# Patient Record
Sex: Female | Born: 1965
Health system: Southern US, Community
[De-identification: ages and names within clinical notes are randomized; demographics above are authoritative.]

## PROBLEM LIST (undated history)

## (undated) DIAGNOSIS — R079 Chest pain, unspecified: Secondary | ICD-10-CM

## (undated) DIAGNOSIS — R011 Cardiac murmur, unspecified: Secondary | ICD-10-CM

## (undated) DIAGNOSIS — D649 Anemia, unspecified: Secondary | ICD-10-CM

## (undated) HISTORY — DX: Anemia, unspecified: D64.9

## (undated) HISTORY — DX: Chest pain, unspecified: R07.9

---

## 2002-01-18 HISTORY — PX: TUBAL LIGATION: SHX77

## 2002-04-06 ENCOUNTER — Emergency Department (HOSPITAL_COMMUNITY): Admission: EM | Admit: 2002-04-06 | Discharge: 2002-04-07 | Payer: Self-pay | Admitting: Emergency Medicine

## 2002-05-24 ENCOUNTER — Ambulatory Visit (HOSPITAL_COMMUNITY): Admission: RE | Admit: 2002-05-24 | Discharge: 2002-05-24 | Payer: Self-pay | Admitting: *Deleted

## 2002-05-25 ENCOUNTER — Ambulatory Visit (HOSPITAL_COMMUNITY): Admission: RE | Admit: 2002-05-25 | Discharge: 2002-05-25 | Payer: Self-pay | Admitting: *Deleted

## 2002-05-25 ENCOUNTER — Encounter: Payer: Self-pay | Admitting: *Deleted

## 2002-06-05 ENCOUNTER — Ambulatory Visit (HOSPITAL_COMMUNITY): Admission: RE | Admit: 2002-06-05 | Discharge: 2002-06-05 | Payer: Self-pay | Admitting: *Deleted

## 2002-06-05 ENCOUNTER — Encounter: Payer: Self-pay | Admitting: *Deleted

## 2002-07-24 ENCOUNTER — Ambulatory Visit (HOSPITAL_COMMUNITY): Admission: RE | Admit: 2002-07-24 | Discharge: 2002-07-24 | Payer: Self-pay | Admitting: *Deleted

## 2002-08-02 ENCOUNTER — Inpatient Hospital Stay (HOSPITAL_COMMUNITY): Admission: AD | Admit: 2002-08-02 | Discharge: 2002-08-02 | Payer: Self-pay | Admitting: *Deleted

## 2002-08-02 ENCOUNTER — Encounter: Payer: Self-pay | Admitting: Obstetrics & Gynecology

## 2002-08-04 ENCOUNTER — Inpatient Hospital Stay (HOSPITAL_COMMUNITY): Admission: AD | Admit: 2002-08-04 | Discharge: 2002-08-04 | Payer: Self-pay | Admitting: Obstetrics & Gynecology

## 2002-08-14 ENCOUNTER — Ambulatory Visit (HOSPITAL_COMMUNITY): Admission: RE | Admit: 2002-08-14 | Discharge: 2002-08-14 | Payer: Self-pay | Admitting: *Deleted

## 2002-08-21 ENCOUNTER — Inpatient Hospital Stay (HOSPITAL_COMMUNITY): Admission: AD | Admit: 2002-08-21 | Discharge: 2002-08-21 | Payer: Self-pay | Admitting: *Deleted

## 2002-09-02 ENCOUNTER — Inpatient Hospital Stay (HOSPITAL_COMMUNITY): Admission: AD | Admit: 2002-09-02 | Discharge: 2002-09-02 | Payer: Self-pay | Admitting: Family Medicine

## 2002-09-18 ENCOUNTER — Ambulatory Visit (HOSPITAL_COMMUNITY): Admission: RE | Admit: 2002-09-18 | Discharge: 2002-09-18 | Payer: Self-pay | Admitting: *Deleted

## 2002-09-26 ENCOUNTER — Encounter: Payer: Self-pay | Admitting: Obstetrics and Gynecology

## 2002-09-26 ENCOUNTER — Inpatient Hospital Stay (HOSPITAL_COMMUNITY): Admission: AD | Admit: 2002-09-26 | Discharge: 2002-09-26 | Payer: Self-pay | Admitting: Obstetrics and Gynecology

## 2002-09-28 ENCOUNTER — Inpatient Hospital Stay (HOSPITAL_COMMUNITY): Admission: AD | Admit: 2002-09-28 | Discharge: 2002-09-28 | Payer: Self-pay | Admitting: Family Medicine

## 2002-10-08 ENCOUNTER — Inpatient Hospital Stay (HOSPITAL_COMMUNITY): Admission: AD | Admit: 2002-10-08 | Discharge: 2002-10-08 | Payer: Self-pay | Admitting: Obstetrics & Gynecology

## 2002-10-09 ENCOUNTER — Inpatient Hospital Stay (HOSPITAL_COMMUNITY): Admission: RE | Admit: 2002-10-09 | Discharge: 2002-10-12 | Payer: Self-pay | Admitting: *Deleted

## 2002-10-09 ENCOUNTER — Encounter (INDEPENDENT_AMBULATORY_CARE_PROVIDER_SITE_OTHER): Payer: Self-pay

## 2002-12-17 ENCOUNTER — Emergency Department (HOSPITAL_COMMUNITY): Admission: EM | Admit: 2002-12-17 | Discharge: 2002-12-17 | Payer: Self-pay | Admitting: Emergency Medicine

## 2003-11-27 ENCOUNTER — Emergency Department (HOSPITAL_COMMUNITY): Admission: EM | Admit: 2003-11-27 | Discharge: 2003-11-27 | Payer: Self-pay | Admitting: Emergency Medicine

## 2004-01-21 ENCOUNTER — Ambulatory Visit: Payer: Self-pay | Admitting: Family Medicine

## 2004-07-14 ENCOUNTER — Ambulatory Visit: Payer: Self-pay | Admitting: Family Medicine

## 2004-09-01 ENCOUNTER — Ambulatory Visit: Payer: Self-pay | Admitting: Family Medicine

## 2004-12-03 ENCOUNTER — Emergency Department (HOSPITAL_COMMUNITY): Admission: EM | Admit: 2004-12-03 | Discharge: 2004-12-03 | Payer: Self-pay | Admitting: Emergency Medicine

## 2004-12-30 ENCOUNTER — Ambulatory Visit: Payer: Self-pay | Admitting: Family Medicine

## 2005-08-31 ENCOUNTER — Ambulatory Visit: Payer: Self-pay | Admitting: Family Medicine

## 2005-11-03 ENCOUNTER — Ambulatory Visit: Payer: Self-pay | Admitting: Family Medicine

## 2005-11-03 ENCOUNTER — Encounter (INDEPENDENT_AMBULATORY_CARE_PROVIDER_SITE_OTHER): Payer: Self-pay | Admitting: Family Medicine

## 2005-11-03 LAB — CONVERTED CEMR LAB: WBC, blood: 7.4 10*3/uL

## 2005-11-05 ENCOUNTER — Ambulatory Visit: Payer: Self-pay | Admitting: Family Medicine

## 2005-11-09 ENCOUNTER — Ambulatory Visit (HOSPITAL_COMMUNITY): Admission: RE | Admit: 2005-11-09 | Discharge: 2005-11-09 | Payer: Self-pay | Admitting: Family Medicine

## 2005-11-16 ENCOUNTER — Ambulatory Visit: Payer: Self-pay | Admitting: *Deleted

## 2005-11-16 ENCOUNTER — Ambulatory Visit: Payer: Self-pay | Admitting: Family Medicine

## 2005-11-16 DIAGNOSIS — D509 Iron deficiency anemia, unspecified: Secondary | ICD-10-CM | POA: Insufficient documentation

## 2005-12-01 ENCOUNTER — Encounter: Admission: RE | Admit: 2005-12-01 | Discharge: 2005-12-01 | Payer: Self-pay | Admitting: Family Medicine

## 2006-01-31 ENCOUNTER — Ambulatory Visit: Payer: Self-pay | Admitting: Family Medicine

## 2006-02-25 ENCOUNTER — Emergency Department (HOSPITAL_COMMUNITY): Admission: EM | Admit: 2006-02-25 | Discharge: 2006-02-25 | Payer: Self-pay | Admitting: Emergency Medicine

## 2006-05-21 ENCOUNTER — Encounter (INDEPENDENT_AMBULATORY_CARE_PROVIDER_SITE_OTHER): Payer: Self-pay | Admitting: Family Medicine

## 2006-05-21 DIAGNOSIS — M722 Plantar fascial fibromatosis: Secondary | ICD-10-CM | POA: Insufficient documentation

## 2006-09-13 ENCOUNTER — Telehealth (INDEPENDENT_AMBULATORY_CARE_PROVIDER_SITE_OTHER): Payer: Self-pay | Admitting: *Deleted

## 2006-09-13 ENCOUNTER — Ambulatory Visit: Payer: Self-pay | Admitting: Family Medicine

## 2006-10-05 ENCOUNTER — Encounter (INDEPENDENT_AMBULATORY_CARE_PROVIDER_SITE_OTHER): Payer: Self-pay | Admitting: *Deleted

## 2007-03-09 ENCOUNTER — Ambulatory Visit: Payer: Self-pay | Admitting: Nurse Practitioner

## 2007-03-09 DIAGNOSIS — L02429 Furuncle of limb, unspecified: Secondary | ICD-10-CM | POA: Insufficient documentation

## 2007-03-09 DIAGNOSIS — H6690 Otitis media, unspecified, unspecified ear: Secondary | ICD-10-CM | POA: Insufficient documentation

## 2007-03-09 DIAGNOSIS — L02439 Carbuncle of limb, unspecified: Secondary | ICD-10-CM | POA: Insufficient documentation

## 2007-06-02 ENCOUNTER — Ambulatory Visit: Payer: Self-pay | Admitting: Internal Medicine

## 2007-06-02 DIAGNOSIS — J309 Allergic rhinitis, unspecified: Secondary | ICD-10-CM | POA: Insufficient documentation

## 2007-06-19 ENCOUNTER — Telehealth (INDEPENDENT_AMBULATORY_CARE_PROVIDER_SITE_OTHER): Payer: Self-pay | Admitting: Family Medicine

## 2007-06-20 ENCOUNTER — Ambulatory Visit: Payer: Self-pay | Admitting: Internal Medicine

## 2007-08-08 ENCOUNTER — Ambulatory Visit: Payer: Self-pay | Admitting: Family Medicine

## 2007-08-08 ENCOUNTER — Encounter (INDEPENDENT_AMBULATORY_CARE_PROVIDER_SITE_OTHER): Payer: Self-pay | Admitting: Family Medicine

## 2007-08-08 DIAGNOSIS — K029 Dental caries, unspecified: Secondary | ICD-10-CM | POA: Insufficient documentation

## 2007-08-08 LAB — CONVERTED CEMR LAB
ALT: 9 units/L (ref 0–35)
Albumin: 4.3 g/dL (ref 3.5–5.2)
Basophils Absolute: 0 10*3/uL (ref 0.0–0.1)
Bilirubin Urine: NEGATIVE
CO2: 20 meq/L (ref 19–32)
Calcium: 9.1 mg/dL (ref 8.4–10.5)
Chloride: 102 meq/L (ref 96–112)
Cholesterol: 172 mg/dL (ref 0–200)
Glucose, Urine, Semiquant: NEGATIVE
Lymphocytes Relative: 41 % (ref 12–46)
Lymphs Abs: 3.3 10*3/uL (ref 0.7–4.0)
Neutro Abs: 4 10*3/uL (ref 1.7–7.7)
Platelets: 274 10*3/uL (ref 150–400)
Potassium: 4.6 meq/L (ref 3.5–5.3)
RDW: 16.3 % — ABNORMAL HIGH (ref 11.5–15.5)
Sodium: 136 meq/L (ref 135–145)
Total Protein: 7.2 g/dL (ref 6.0–8.3)
VLDL: 11 mg/dL (ref 0–40)
WBC Urine, dipstick: NEGATIVE
WBC: 8.1 10*3/uL (ref 4.0–10.5)
pH: 5

## 2007-08-11 ENCOUNTER — Ambulatory Visit: Payer: Self-pay | Admitting: Family Medicine

## 2007-08-16 ENCOUNTER — Encounter (INDEPENDENT_AMBULATORY_CARE_PROVIDER_SITE_OTHER): Payer: Self-pay | Admitting: Family Medicine

## 2007-08-24 ENCOUNTER — Encounter: Admission: RE | Admit: 2007-08-24 | Discharge: 2007-08-24 | Payer: Self-pay | Admitting: Family Medicine

## 2007-08-30 ENCOUNTER — Telehealth (INDEPENDENT_AMBULATORY_CARE_PROVIDER_SITE_OTHER): Payer: Self-pay | Admitting: Family Medicine

## 2007-08-30 ENCOUNTER — Ambulatory Visit: Payer: Self-pay | Admitting: Family Medicine

## 2007-08-30 DIAGNOSIS — M79609 Pain in unspecified limb: Secondary | ICD-10-CM | POA: Insufficient documentation

## 2007-08-30 LAB — HM MAMMOGRAPHY

## 2007-09-12 ENCOUNTER — Encounter (INDEPENDENT_AMBULATORY_CARE_PROVIDER_SITE_OTHER): Payer: Self-pay | Admitting: Family Medicine

## 2007-12-13 ENCOUNTER — Telehealth (INDEPENDENT_AMBULATORY_CARE_PROVIDER_SITE_OTHER): Payer: Self-pay | Admitting: Family Medicine

## 2008-01-15 ENCOUNTER — Ambulatory Visit: Payer: Self-pay | Admitting: Family Medicine

## 2008-02-28 ENCOUNTER — Ambulatory Visit: Payer: Self-pay | Admitting: Family Medicine

## 2008-02-28 DIAGNOSIS — N951 Menopausal and female climacteric states: Secondary | ICD-10-CM | POA: Insufficient documentation

## 2008-02-28 DIAGNOSIS — N760 Acute vaginitis: Secondary | ICD-10-CM | POA: Insufficient documentation

## 2008-02-28 LAB — CONVERTED CEMR LAB
Chlamydia, DNA Probe: NEGATIVE
GC Probe Amp, Genital: NEGATIVE

## 2008-03-04 ENCOUNTER — Encounter (INDEPENDENT_AMBULATORY_CARE_PROVIDER_SITE_OTHER): Payer: Self-pay | Admitting: Family Medicine

## 2008-03-04 LAB — CONVERTED CEMR LAB
FSH: 3.7 milliintl units/mL
LH: 8 milliintl units/mL

## 2008-03-05 ENCOUNTER — Encounter (INDEPENDENT_AMBULATORY_CARE_PROVIDER_SITE_OTHER): Payer: Self-pay | Admitting: Family Medicine

## 2008-06-18 ENCOUNTER — Telehealth (INDEPENDENT_AMBULATORY_CARE_PROVIDER_SITE_OTHER): Payer: Self-pay | Admitting: *Deleted

## 2008-08-16 ENCOUNTER — Ambulatory Visit: Payer: Self-pay | Admitting: Internal Medicine

## 2008-08-19 ENCOUNTER — Ambulatory Visit: Payer: Self-pay | Admitting: Nurse Practitioner

## 2008-10-07 ENCOUNTER — Ambulatory Visit: Payer: Self-pay | Admitting: Physician Assistant

## 2008-10-07 DIAGNOSIS — N921 Excessive and frequent menstruation with irregular cycle: Secondary | ICD-10-CM | POA: Insufficient documentation

## 2008-10-10 ENCOUNTER — Telehealth (INDEPENDENT_AMBULATORY_CARE_PROVIDER_SITE_OTHER): Payer: Self-pay | Admitting: *Deleted

## 2008-10-10 ENCOUNTER — Telehealth: Payer: Self-pay | Admitting: Physician Assistant

## 2008-10-10 LAB — CONVERTED CEMR LAB
Basophils Absolute: 0 10*3/uL (ref 0.0–0.1)
Basophils Relative: 0 % (ref 0–1)
Hemoglobin: 11.6 g/dL — ABNORMAL LOW (ref 12.0–15.0)
MCHC: 33.9 g/dL (ref 30.0–36.0)
Monocytes Absolute: 0.6 10*3/uL (ref 0.1–1.0)
Neutro Abs: 4 10*3/uL (ref 1.7–7.7)
RDW: 15.6 % — ABNORMAL HIGH (ref 11.5–15.5)

## 2008-10-14 ENCOUNTER — Ambulatory Visit: Payer: Self-pay | Admitting: Physician Assistant

## 2008-10-15 LAB — CONVERTED CEMR LAB
Iron: 44 ug/dL (ref 42–145)
Retic Ct Pct: 1.4 % (ref 0.4–3.1)

## 2009-02-09 ENCOUNTER — Emergency Department (HOSPITAL_COMMUNITY): Admission: EM | Admit: 2009-02-09 | Discharge: 2009-02-09 | Payer: Self-pay | Admitting: Family Medicine

## 2009-02-11 ENCOUNTER — Emergency Department (HOSPITAL_COMMUNITY): Admission: EM | Admit: 2009-02-11 | Discharge: 2009-02-11 | Payer: Self-pay | Admitting: Family Medicine

## 2009-02-22 ENCOUNTER — Emergency Department (HOSPITAL_COMMUNITY): Admission: EM | Admit: 2009-02-22 | Discharge: 2009-02-22 | Payer: Self-pay | Admitting: Emergency Medicine

## 2009-07-15 ENCOUNTER — Ambulatory Visit: Payer: Self-pay | Admitting: Physician Assistant

## 2009-07-15 ENCOUNTER — Other Ambulatory Visit: Admission: RE | Admit: 2009-07-15 | Discharge: 2009-07-15 | Payer: Self-pay | Admitting: Internal Medicine

## 2009-07-15 DIAGNOSIS — R011 Cardiac murmur, unspecified: Secondary | ICD-10-CM | POA: Insufficient documentation

## 2009-07-15 DIAGNOSIS — H0019 Chalazion unspecified eye, unspecified eyelid: Secondary | ICD-10-CM | POA: Insufficient documentation

## 2009-07-15 LAB — CONVERTED CEMR LAB
Blood in Urine, dipstick: NEGATIVE
Glucose, Urine, Semiquant: NEGATIVE
KOH Prep: NEGATIVE
Specific Gravity, Urine: 1.015
WBC Urine, dipstick: NEGATIVE
pH: 7.5

## 2009-07-16 ENCOUNTER — Encounter: Payer: Self-pay | Admitting: Physician Assistant

## 2009-07-16 DIAGNOSIS — E0590 Thyrotoxicosis, unspecified without thyrotoxic crisis or storm: Secondary | ICD-10-CM | POA: Insufficient documentation

## 2009-07-16 LAB — CONVERTED CEMR LAB
AST: 12 units/L (ref 0–37)
Albumin: 4.2 g/dL (ref 3.5–5.2)
Alkaline Phosphatase: 60 units/L (ref 39–117)
BUN: 17 mg/dL (ref 6–23)
Basophils Absolute: 0 10*3/uL (ref 0.0–0.1)
Chlamydia, DNA Probe: NEGATIVE
GC Probe Amp, Genital: NEGATIVE
Lymphocytes Relative: 37 % (ref 12–46)
Neutro Abs: 5 10*3/uL (ref 1.7–7.7)
Platelets: 276 10*3/uL (ref 150–400)
Potassium: 4.6 meq/L (ref 3.5–5.3)
RDW: 15.9 % — ABNORMAL HIGH (ref 11.5–15.5)
Sodium: 140 meq/L (ref 135–145)
Total Bilirubin: 0.4 mg/dL (ref 0.3–1.2)
Total Protein: 7.1 g/dL (ref 6.0–8.3)

## 2009-07-17 ENCOUNTER — Encounter: Payer: Self-pay | Admitting: Physician Assistant

## 2009-07-18 ENCOUNTER — Ambulatory Visit (HOSPITAL_COMMUNITY): Admission: RE | Admit: 2009-07-18 | Discharge: 2009-07-18 | Payer: Self-pay | Admitting: Internal Medicine

## 2009-07-24 ENCOUNTER — Encounter (INDEPENDENT_AMBULATORY_CARE_PROVIDER_SITE_OTHER): Payer: Self-pay | Admitting: *Deleted

## 2009-07-28 ENCOUNTER — Telehealth: Payer: Self-pay | Admitting: Physician Assistant

## 2009-08-04 ENCOUNTER — Encounter: Payer: Self-pay | Admitting: Physician Assistant

## 2009-08-20 ENCOUNTER — Telehealth: Payer: Self-pay | Admitting: Physician Assistant

## 2009-08-20 ENCOUNTER — Ambulatory Visit: Payer: Self-pay | Admitting: Physician Assistant

## 2009-08-21 ENCOUNTER — Telehealth (INDEPENDENT_AMBULATORY_CARE_PROVIDER_SITE_OTHER): Payer: Self-pay | Admitting: *Deleted

## 2009-08-22 ENCOUNTER — Ambulatory Visit: Payer: Self-pay | Admitting: Physician Assistant

## 2009-09-02 ENCOUNTER — Telehealth: Payer: Self-pay | Admitting: Physician Assistant

## 2009-10-07 ENCOUNTER — Ambulatory Visit: Payer: Self-pay | Admitting: Internal Medicine

## 2009-10-07 ENCOUNTER — Encounter: Payer: Self-pay | Admitting: Physician Assistant

## 2009-10-08 ENCOUNTER — Encounter: Payer: Self-pay | Admitting: Physician Assistant

## 2009-10-08 LAB — CONVERTED CEMR LAB
Free T4: 0.96 ng/dL (ref 0.80–1.80)
T3, Free: 2.6 pg/mL (ref 2.3–4.2)
Uric Acid, Serum: 4.5 mg/dL (ref 2.4–7.0)

## 2009-11-05 ENCOUNTER — Ambulatory Visit: Payer: Self-pay | Admitting: Internal Medicine

## 2009-12-05 ENCOUNTER — Inpatient Hospital Stay (HOSPITAL_COMMUNITY)
Admission: EM | Admit: 2009-12-05 | Discharge: 2009-12-09 | Payer: Self-pay | Source: Home / Self Care | Admitting: Emergency Medicine

## 2009-12-05 ENCOUNTER — Emergency Department (HOSPITAL_COMMUNITY)
Admission: EM | Admit: 2009-12-05 | Discharge: 2009-12-05 | Disposition: A | Discharge status: Other Acute Inpatient Hospital | Payer: Self-pay | Source: Home / Self Care | Admitting: Emergency Medicine

## 2009-12-19 ENCOUNTER — Encounter: Payer: Self-pay | Admitting: Physician Assistant

## 2009-12-23 ENCOUNTER — Encounter (INDEPENDENT_AMBULATORY_CARE_PROVIDER_SITE_OTHER): Payer: Self-pay | Admitting: Internal Medicine

## 2009-12-23 ENCOUNTER — Telehealth (INDEPENDENT_AMBULATORY_CARE_PROVIDER_SITE_OTHER): Payer: Self-pay | Admitting: Internal Medicine

## 2010-01-20 ENCOUNTER — Ambulatory Visit: Admit: 2010-01-20 | Payer: Self-pay | Admitting: Internal Medicine

## 2010-02-08 ENCOUNTER — Encounter: Payer: Self-pay | Admitting: Occupational Therapy

## 2010-02-09 ENCOUNTER — Encounter: Payer: Self-pay | Admitting: Internal Medicine

## 2010-02-17 NOTE — Letter (Signed)
Summary: *HSN Results Follow up  [facility]  [address] [place], [place] [address]   Phone: [phone]  Fax: [phone]      10/08/2009   Kathryn Miller [address] [place], [place]  [address]   Dear  Ms. Kathryn Miller,                            ____S.Drinkard,FNP   ____D. Gore,FNP       ____B. McPherson,MD   ____V. Rankins,MD    ____E. Mulberry,MD    ____N. Daphine Deutscher, FNP  ____D. Reche Dixon, MD    ____K. Philipp Deputy, MD    __x__S. Alben Spittle, PA-C     This letter is to inform you that your recent test(s):  _______Pap Smear    ___x____Lab Test     _______X-ray    ___x____ is within acceptable limits  _______ requires a medication change  _______ requires a follow-up lab visit  _______ requires a follow-up visit with your provider   Comments:  Thyroid test is normal.  The uric acid test is also normal.  This means you do not have gout.       _________________________________________________________ If you have any questions, please contact our office                     Sincerely,  Tereso Newcomer PA-C [facility]

## 2010-02-17 NOTE — Letter (Signed)
Summary: *HSN Results Follow up  [facility]  [address] [place], [place] [address]   Phone: [phone]  Fax: [phone]      07/24/2009   KENNEDEE KITZMILLER [address] [place], [place]  [address]   Dear  Ms. Valentina Shaggy,                            ____S.Drinkard,FNP   ____D. Gore,FNP       ____B. McPherson,MD   ____V. Rankins,MD    ____E. Mulberry,MD    ____N. Daphine Deutscher, FNP  ____D. Reche Dixon, MD    ____K. Philipp Deputy, MD    ____Other     This letter is to inform you that your recent test(s):  _______Pap Smear    _______Lab Test     _______X-ray    _______ is within acceptable limits  _______ requires a medication change  _______ requires a follow-up lab visit  _______ requires a follow-up visit with your provider   Comments:We have been trying to contact you.  Please give the office a call at your earliest convenience.       _________________________________________________________ If you have any questions, please contact our office                     Sincerely,  Armenia Shannon [facility]

## 2010-02-17 NOTE — Letter (Signed)
Summary: *HSN Results Follow up  [facility]  [address] [place], [place] [address]   Phone: [phone]  Fax: [phone]      07/17/2009   MARAJADE LEI [address] [place], [place]  [address]   Dear  Ms. Valentina Shaggy,                            ____S.Drinkard,FNP   ____D. Gore,FNP       ____B. McPherson,MD   ____V. Rankins,MD    ____E. Mulberry,MD    ____N. Daphine Deutscher, FNP  ____D. Reche Dixon, MD    ____K. Philipp Deputy, MD    __x__S. Alben Spittle, PA-C     This letter is to inform you that your recent test(s):  ___x____Pap Smear    _______Lab Test     _______X-ray    ____x___ is within acceptable limits  _______ requires a medication change  _______ requires a follow-up lab visit  _______ requires a follow-up visit with your provider   Comments:       _________________________________________________________ If you have any questions, please contact our office                     Sincerely,  Tereso Newcomer PA-C [facility]

## 2010-02-17 NOTE — Progress Notes (Signed)
Summary: Medical Form   Phone Note Call from Patient   Summary of Call: Ms Teagyn left a medical form for the provider to filled out.  Please let her know when is ready. Alben Spittle PA-c Initial call taken by: Manon Hilding,  July 28, 2009 9:27 AM  Follow-up for Phone Call        PUT FORM IN YOUR REFILL SLOT Follow-up by: Arta Bruce,  July 28, 2009 9:30 AM  Additional Follow-up for Phone Call Additional follow up Details #1::        Done.  Will put in basket tomorrow. Additional Follow-up by: Tereso Newcomer PA-C,  August 04, 2009 10:44 AM    Additional Follow-up for Phone Call Additional follow up Details #2::    pt is aware... Armenia Shannon  August 04, 2009 11:39 AM

## 2010-02-17 NOTE — Letter (Signed)
Summary: Generic Letter  [facility]  [address] [place], [place] [address]   Phone: [phone]  Fax: [phone]    12/23/2009  Re:  [name]      [address]      [place], [place]  [address]  To Whom It May Concern:  Ms.  Kathryn Miller is a patient at our clinic and is in good health.      Sincerely,   Julieanne Manson MD

## 2010-02-17 NOTE — Letter (Signed)
Summary: [facility]IL[facility]  [facility]   Imported By: Arta Bruce 08/05/2009 12:08:47  _____________________________________________________________________  External Attachment:    Type:   Image     Comment:   External Document

## 2010-02-17 NOTE — Progress Notes (Signed)
Summary: PT NEED A LETTER  Phone Note Call from Patient   Summary of Call: PATIENT NEED A LETTER FOR  THE [facility] OFF  [place] WHEN  DID SHE HAVE HER CP AND LET THEM KNOW THAT SHE IS ON [facility] PATIENT PHONE [phone] Initial call taken by: Domenic Polite,  December 23, 2009 12:09 PM  Follow-up for Phone Call        May pick up Follow-up by: Julieanne Manson MD,  December 23, 2009 7:47 PM

## 2010-02-17 NOTE — Assessment & Plan Note (Signed)
Summary: CPP/////KT   Vital Signs:  Patient profile:   45 year old female Height:      62 inches Weight:      221 pounds BMI:     40.57 Temp:     98.0 degrees F oral Pulse rate:   79 / minute Pulse rhythm:   regular Resp:     18 per minute BP sitting:   111 / 74  (left arm) Cuff size:   regular  Vitals Entered By: Armenia Shannon (July 15, 2009 3:25 PM)  Primary Care Provider:  Tereso Newcomer PA-C  CC:  cpp.  History of Present Illness: Here for PAP.  Health Maint: LMP 06/18/2009 Has a h/o an abnormal pap in past. Has heavy cycles . . . first 2 days are heavy. Has a h/o iron deficiency related to this.  Takes iron. Was to get ultrasound in past due to menorrhagia, but she never did. No vaginal discharge. Needs mammo. Not taking calcium. PHQ9=2; 1st 2 questions negative.  Toothache:  Notes for about 2 weeks.  Notes on right side.  Feels in ear.  Worse if eat something sweet or cold or any food.  Face does not feel swollen.  Has not seen anyone.  No allergies.  Eye pain:  Right eyelid.  Noted for about a week.  Notes that it feels like something in her eye.  Bump on inside of eyelid.  Looks white sometimes.  Gets film over eye at times.  No trouble seeing.  No eye pain.  Foot pain:  Had surgery as a child due to club feet.  Has seen podiatry in the past for foot pain.  Has had some heel pain on the right for a few months.  Notes worse in am and gets somewhat better with walking.  Has taken ibuprofen without much success.  Would like to go to podiatry again.  Is wearing heel inserts.    Habits & Providers  Alcohol-Tobacco-Diet     Tobacco Status: never  Exercise-Depression-Behavior     Drug Use: no  Problems Prior to Update: 1)  Chalazion  (ICD-373.2) 2)  Systolic Murmur  (ICD-785.2) 3)  Family History Diabetes 1st Degree Relative  (ICD-V18.0) 4)  Metrorrhagia  (ICD-626.6) 5)  Bacterial Vaginitis  (ICD-616.10) 6)  Hot Flashes  (ICD-627.2) 7)  Foot Pain, Right   (ICD-729.5) 8)  Dental Caries  (ICD-521.00) 9)  Screening For Mlig Neop, Breast, Nos  (ICD-V76.10) 10)  Screening For Malignant Neoplasm, Cervix  (ICD-V76.2) 11)  Examination, Routine Medical  (ICD-V70.0) 12)  Allergic Rhinitis  (ICD-477.9) 13)  Carbuncle, Arm  (ICD-680.3) 14)  Otitis Media, Left  (ICD-382.9) 15)  Plantar Fasciitis  (ICD-728.71) 16)  Anemia-iron Deficiency  (ICD-280.9) 17)  Tubal Ligation, Hx of  (ICD-V26.51)  Current Medications (verified): 1)  Motrin Ib 200 Mg  Tabs (Ibuprofen) .... Prn 2)  Metronidazole 500 Mg Tabs (Metronidazole) .... Take One Tablet By Mouth Two Times A Day X 5 Days 3)  Allegra 180 Mg Tabs (Fexofenadine Hcl) .... Take 1 Tablet By Mouth Once A Day As Needed For Allergies 4)  Flonase 50 Mcg/act Susp (Fluticasone Propionate) .... 2 Sprays Each Nostril Once Daily For One Month, Then Taper To Off  Allergies (verified): No Known Drug Allergies  Past History:  Past Medical History: Last updated: 05/21/2006 Anemia-iron deficiency (11/16/2005)  Past Surgical History: Reviewed history from 08/08/2007 and no changes required. Caesarean section x 4 s/p orthopedic correction of club foot/arm as an infant s/p removal  of accessory nipples age 3 s/p "neck surgery" child s/p breast cyst removal age 59 Tubal ligation  Family History: No colon, ovarian or breast cancer. 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 Eyes:  See HPI. CV:  Denies chest pain or discomfort and shortness of breath with exertion. Resp:  Denies cough. GI:  Denies bloody stools and dark tarry stools. GU:  Denies hematuria. MS:  See HPI. Derm:  Denies rash. Psych:   Denies depression. Endo:  Denies cold intolerance and heat intolerance. Heme:  Denies bleeding.  Physical Exam  General:  alert, well-developed, and well-nourished.   Head:  normocephalic and atraumatic.   Eyes:  pupils equal, pupils round, pupils reactive to light, no optic disk abnormalities, and chalazion right bottom lid.   Ears:  R ear normal and L ear normal.   Nose:  no external deformity.   Mouth:  pharynx pink and moist, no erythema, no exudates, and poor dentition.   next to last molar on bottom right with obvious large cavity  Neck:  supple, no thyromegaly, and no cervical lymphadenopathy.   Breasts:  skin/areolae normal, no masses, no abnormal thickening, no nipple discharge, no tenderness, and no adenopathy.   Lungs:  normal breath sounds, no crackles, and no wheezes.   Heart:  normal rate and regular rhythm.   2/6 systolic murmur at RUSB murmur increases with valsalva  Abdomen:  soft, non-tender, normal bowel sounds, and no hepatomegaly.   Rectal:  no external abnormalities.   Genitalia:  normal introitus, no external lesions, mucosa pink and moist, no vaginal or cervical lesions, no vaginal atrophy, and no friaility or hemorrhage.   difficult exam due to body habitus fundus difficult to palpate due to body habitus adnexae could not be palpated  Msk:  + heel pain right foot with palp + swelling in right ankle no crepitus or deformity in right ankle  Pulses:  R posterior tibial normal, R dorsalis pedis normal, L posterior tibial normal, and L dorsalis pedis normal.   Extremities:  no pretibial edema Neurologic:  alert & oriented X3 and cranial nerves II-XII intact.   Skin:  turgor normal.   Psych:  normally interactive.     Impression & Recommendations:  Problem # 1:  SCREENING FOR MLIG NEOP, BREAST, NOS (ICD-V76.10)  Orders: Mammogram (Screening) (Mammo)  Problem # 2:  SCREENING FOR MALIGNANT NEOPLASM, CERVIX (ICD-V76.2)  Orders: KOH/ WET Mount  [phone]) T-Pap Smear, Thin Prep [phone]) T- GC Chlamydia ([address])  Problem # 3:  EXAMINATION, ROUTINE MEDICAL (ICD-V70.0)  Orders: KOH/ WET Mount [phone]) T-Comprehensive Metabolic Panel [phone]) T-CBC w/Diff [phone]) T-Urinalysis ([phone]) T- GC Chlamydia ([address]) T-TSH (36644-03474)  Problem # 4:  ANEMIA-IRON DEFICIENCY (ICD-280.9)  Orders: T-CBC w/Diff ([id number])  Problem # 5:  METRORRHAGIA (ICD-626.6) reschedule ultrasound consider referral to GYN  Orders: T-CBC w/Diff ([id number])  Problem # 6:  SYSTOLIC MURMUR (ZYS-063.0)  son has HOCM her murmur increases with valsalva will get echo  Orders: 2 D Echo (2 D Echo)  Problem # 7:  DENTAL CARIES (ICD-521.00) refer to dental  Orders: Dental Referral (Dentist)  Problem # 8:  CHALAZION (ICD-373.2) warm compresses if no resolution, may  need referral to ophthalmology  Problem # 9:  PLANTAR FASCIITIS (ICD-728.71) naproxen stretches given f/u as needed refer to podiatry if needed  The following medications were removed from the medication list:    Motrin Ib 200 Mg Tabs (Ibuprofen) .Marland Kitchen... Prn Her updated medication list for this problem includes:    Naprosyn 500 Mg Tabs (Naproxen) .Marland Kitchen... Take 1 tablet by mouth two times a day with food as needed for pain  Complete Medication List: 1)  Allegra 180 Mg Tabs (Fexofenadine hcl) .... Take 1 tablet by mouth once a day as needed for allergies 2)  Flonase 50 Mcg/act Susp (Fluticasone propionate) .... 2 sprays each nostril once daily for one month, then taper to off 3)  Naprosyn 500 Mg Tabs (Naproxen) .... Take 1 tablet by mouth two times a day with food as needed for pain  Patient Instructions: 1)  Apply warm compresses to right eye. 2)  The nodule generally resolves on its own.  If it continues, let us know.  We may need to send you to the ophthalmologist. 3)  Take Naproxen 500 mg two times a day with food for 5-7 days.  Then, take two times a day as needed  for pain. 4)  Do the stretches I gave you. 5)  If the pain in your foot is not better in the next 6-8 weeks, call and we can set up a referral back to the foot doctor. 6)  Please schedule a follow-up appointment in 1 year with Josephyne Tarter for CPP or sooner as needed.  Prescriptions: NAPROSYN 500 MG TABS (NAPROXEN) Take 1 tablet by mouth two times a day with food as needed for pain  #30 x 3   Entered and Authorized by:   Tereso Newcomer PA-C   Signed by:   Tereso Newcomer PA-C on 07/15/2009   Method used:   Print then Give to Patient   RxID:   [id number]   Laboratory Results   Urine Tests    Routine Urinalysis   Glucose: negative   (Normal Range: Negative) Bilirubin: negative   (Normal Range: Negative) Ketone: negative   (Normal Range: Negative) Spec. Gravity: 1.015   (Normal Range: 1.003-1.035) Blood: negative   (Normal Range: Negative) pH: 7.5   (Normal Range: 5.0-8.0) Protein: negative   (Normal Range: Negative) Urobilinogen: 1.0   (Normal Range: 0-1) Nitrite: negative   (Normal Range: Negative) Leukocyte Esterace: negative   (Normal Range: Negative)      Wet Mount Source: vaginal WBC/hpf: 1-5 Bacteria/hpf: 1+  Rods Clue cells/hpf: none  Negative whiff Yeast/hpf: none Wet Mount KOH: Negative Trichomonas/hpf: none

## 2010-02-17 NOTE — Assessment & Plan Note (Signed)
Summary: PPD READING///KT   Nurse Visit   Allergies: No Known Drug Allergies  PPD Results    Date of reading: 08/22/2009    Results: < 5mm    Interpretation: negative  Orders Added: 1)  No Charge Patient Arrived (NCPA0) [NCPA0]

## 2010-02-17 NOTE — Progress Notes (Signed)
Summary: Pt requesting the Medical Assistant call her back   Phone Note Call from Patient Call back at [phone]   Summary of Call: The pt is requesting the medical assistant called her back because she has some question about a prescription. Alben Spittle PA-c  Initial call taken by: Manon Hilding,  September 02, 2009 8:24 AM  Follow-up for Phone Call        pt stated that her podiatry appt in oct and pt says she is still in pain on her feet....  pt would like to know if she get a higher doseage of naproxen or if she can get another med to help with the pain until her appt.... [facility]  Follow-up by: Armenia Shannon,  September 03, 2009 8:12 AM  Additional Follow-up for Phone Call Additional follow up Details #1::        Naprosyn is 500mg  by mouth two times a day as needed (with food) she can alternate this with tylenol if necessary Be sure she is wearing supportive shoes - not sandals or flip flops as these offer little support.  She may need to purchase some inserts  Additional Follow-up by: Lehman Prom FNP,  September 04, 2009 5:10 PM    Additional Follow-up for Phone Call Additional follow up Details #2::    pt is aware Follow-up by: Armenia Shannon,  September 05, 2009 10:46 AM

## 2010-02-17 NOTE — Progress Notes (Signed)
Summary: Referral Request   Phone Note Call from Patient Call back at [phone]   Summary of Call: The pt still having problems with her right foot (heel area and ankle) and she is wondering if she can be referral to a podiatry the same she went before ([facility]). Alben Spittle PA-c  Initial call taken by: Manon Hilding,  August 20, 2009 9:54 AM  Follow-up for Phone Call        spoke with pt and she says her heel is still hurting.... pt says its hard to move or walk.... pt says she has done the exercises that you gave her to do .Marland KitchenMarland KitchenMarland KitchenMarland Kitchen pt says she works twelve hours on the weekend and her foot hurts worst.... pt says the meds is not helping.... pt says the pain is awful... pt says she has to stand on it sometime to get rid of the pain... pt says the pressure when she stands that help the pressure but she is unable to walk on it.[facility] Follow-up by: Armenia Shannon,  August 21, 2009 10:59 AM  Additional Follow-up for Phone Call Additional follow up Details #1::        I will put referral in system. She can go to Foot clinic on [facility]. first or if she would rather go to [facility], she can get a referral there.  Explain to her that the [facility] is free. Send to Arna Medici after you talk to patient. Additional Follow-up by: Tereso Newcomer PA-C,  August 21, 2009 2:25 PM    Additional Follow-up for Phone Call Additional follow up Details #2::    pt says she would like to go to [facility] Follow-up by: Armenia Shannon,  August 21, 2009 4:53 PM

## 2010-02-17 NOTE — Progress Notes (Signed)
   Phone Note Call from Patient   Summary of Call: pt has a referral in system to go to [facility]ry... grac[facility], please schedule appt Initial call taken by: Armenia Shannon,  August 21, 2009 4:54 PM  Follow-up for Phone Call        Pt will go in Oct 2011 for a podiatry visit.Kathryn Miller  August 22, 2009 11:15 AM

## 2010-02-17 NOTE — Letter (Signed)
Summary: requesting records for self//picked up  requesting records for self//picked up   Imported By: Arta Bruce 12/19/2009 16:00:48  _____________________________________________________________________  External Attachment:    Type:   Image     Comment:   External Document

## 2010-02-17 NOTE — Assessment & Plan Note (Signed)
Summary: Foot pain   Vital Signs:  Patient profile:   45 year old female Height:      62 inches Weight:      216 pounds BMI:     39.65 Temp:     98.0 degrees F oral Pulse rate:   88 / minute Pulse rhythm:   regular Resp:     20 per minute BP sitting:   120 / 82  (left arm) Cuff size:   regular  Vitals Entered By: Armenia Shannon (October 07, 2009 4:10 PM) CC: pt is here for feet pain... Is Patient Diabetic? No Pain Assessment Patient in pain? no       Does patient need assistance? Functional Status Self care Ambulation Normal   Primary Care Provider:  Tereso Newcomer PA-C  CC:  pt is here for feet pain....  History of Present Illness: Here for foot pain.  Referred to foot clinic.  Appt not until next month.  Naprosyn not helping.  Works as Lawyer at [employer].  Always on feet.  Hurts more after work.  Has had some swelling over lateral ankle on right.  Pain is severe.  Hurts to have bed sheets touch her ankle.  Never had gout.  Does not eat a lot of shellfish, red meats, or drink a lot of beer.  She has a h/o club foot.  S/p surgery as a child.  She is doing exercises I showed her for plantar fasciitis.  She is wearing tennis shoes all the time.   Problems Prior to Update: 1)  Hyperthyroidism  (ICD-242.90) 2)  Chalazion  (ICD-373.2) 3)  Systolic Murmur  (ICD-785.2) 4)  Family History Diabetes 1st Degree Relative  (ICD-V18.0) 5)  Metrorrhagia  (ICD-626.6) 6)  Bacterial Vaginitis  (ICD-616.10) 7)  Hot Flashes  (ICD-627.2) 8)  Foot Pain, Right  (ICD-729.5) 9)  Dental Caries  (ICD-521.00) 10)  Screening For Mlig Neop, Breast, Nos  (ICD-V76.10) 11)  Screening For Malignant Neoplasm, Cervix  (ICD-V76.2) 12)  Examination, Routine Medical  (ICD-V70.0) 13)  Allergic Rhinitis  (ICD-477.9) 14)  Carbuncle, Arm  (ICD-680.3) 15)  Otitis Media, Left  (ICD-382.9) 16)  Plantar Fasciitis  (ICD-728.71) 17)  Anemia-iron Deficiency  (ICD-280.9) 18)  Tubal Ligation, Hx of   (ICD-V26.51)  Current Medications (verified): 1)  Allegra 180 Mg Tabs (Fexofenadine Hcl) .... Take 1 Tablet By Mouth Once A Day As Needed For Allergies 2)  Flonase 50 Mcg/act Susp (Fluticasone Propionate) .... 2 Sprays Each Nostril Once Daily For One Month, Then Taper To Off 3)  Naprosyn 500 Mg Tabs (Naproxen) .... Take 1 Tablet By Mouth Two Times A Day With Food As Needed For Pain  Allergies (verified): No Known Drug Allergies  Physical Exam  General:  alert, well-developed, and well-nourished.   Head:  normocephalic and atraumatic.   Neurologic:  alert & oriented X3 and cranial nerves II-XII intact.   Psych:  normally interactive.     Foot/Ankle Exam  Skin:    no erythema faint scar ant right leg   Inspection:    no deformity she does have a mild amt of edema over lat malleolus  Palpation:    tenderness R-lateral malleoulus: and tenderness L-lateral malleoulus:.   + tend bilat heels  Vascular:    dorsalis pedis and posterior tibial pulses 2+ and symmetric, capillary refill < 2 seconds, normal hair pattern, no evidence of ischemia.    Impression & Recommendations:  Problem # 1:  FOOT PAIN (ICD-729.5)  likely all related  to ligamentous injury from prior h/o club foot with h/o surgery and plantar fasciitis however, she does have a lot of tend over lat malleolus and does have some swelling ? if she could have gout check uric acid level if high, consider allopurinol once this pain is improved get xrays change naproxen to indocin tramadol as needed will give note to remain out of work for 1 weekend (only works weekends) f/u with podiatry next month as scheduled  Orders: Diagnostic X-Ray/Fluoroscopy (Diagnostic X-Ray/Flu) T-Uric Acid (Blood) ([address])  Problem # 2:  HYPERTHYROIDISM (ICD-242.90)  needs repeat TFTs today  Orders: T-TSH ([address]11-91478) T-T4, Free ([id number]) T-T3, Free (57846-96295)  Problem # 3:  METRORRHAGIA (ICD-626.6) never got set up  with GYN clinic taking iron  Complete Medication List: 1)  Allegra 180 Mg Tabs (Fexofenadine hcl) .... Take 1 tablet by mouth once a day as needed for allergies 2)  Flonase 50 Mcg/act Susp (Fluticasone propionate) .... 2 sprays each nostril once daily for one month, then taper to off 3)  Indomethacin 25 Mg Caps (Indomethacin) .... Take one to two by mouth every 8 hours as needed for pain 4)  Tramadol Hcl 50 Mg Tabs (Tramadol hcl) .... Take one by mouth two times a day as needed for severe pain  Patient Instructions: 1)  Please arrange appt at next GYN clinic on [facility].  Patient never got appt. 2)  Schedule follow up if foot pain worsens. Prescriptions: TRAMADOL HCL 50 MG TABS (TRAMADOL HCL) Take one by mouth two times a day as needed for severe pain  #30 x 1   Entered and Authorized by:   Tereso Newcomer PA-C   Signed by:   Tereso Newcomer PA-C on 10/07/2009   Method used:   Print then Give to Patient   RxID:   [id number] INDOMETHACIN 25 MG CAPS (INDOMETHACIN) Take one to two by mouth every 8 hours as needed for pain  #30 x 1   Entered and Authorized by:   Tereso Newcomer PA-C   Signed by:   Tereso Newcomer PA-C on 10/07/2009   Method used:   Print then Give to Patient   RxID:   [id number]   Appended Document: Foot pain uric acid level normal

## 2010-02-17 NOTE — Letter (Signed)
Summary: Out of Work  [facility]  [address] [place], [place] [address]   Phone: [phone]  Fax: [phone]    October 07, 2009   Employee:  EDILIA GHUMAN    To Whom It May Concern:   For Medical reasons, please excuse the above named employee from work for the following dates:  Start:   October 07, 2009  End:   October 15, 2009  If you need additional information, please feel free to contact our office.         Sincerely,    Tereso Newcomer PA-C

## 2010-02-17 NOTE — Assessment & Plan Note (Signed)
Summary: TB Test/JM   Nurse Visit   Allergies: No Known Drug Allergies  Immunizations Administered:  PPD Skin Test:    Vaccine Type: PPD    Site: right forearm    Mfr: Sanofi Pasteur    Dose: 0.1 ml    Route: ID    Given by: Linzie Collin    Exp. Date: 02/15/2011    Lot #: [id number]  Orders Added: 1)  Est. Patient Nurse visit [09003] 2)  TB Skin Test [86580] 3)  Admin 1st Vaccine [45409]

## 2010-02-17 NOTE — Letter (Signed)
Summary: DENTAL REFERRAL  DENTAL REFERRAL   Imported By: Arta Bruce 07/18/2009 12:43:57  _____________________________________________________________________  External Attachment:    Type:   Image     Comment:   External Document

## 2010-03-31 LAB — CBC
HCT: 34.2 % — ABNORMAL LOW (ref 36.0–46.0)
MCH: 26.5 pg (ref 26.0–34.0)
MCHC: 34.2 g/dL (ref 30.0–36.0)
MCV: 77.6 fL — ABNORMAL LOW (ref 78.0–100.0)
Platelets: 233 10*3/uL (ref 150–400)
Platelets: 250 10*3/uL (ref 150–400)
Platelets: 259 10*3/uL (ref 150–400)
RBC: 4.2 MIL/uL (ref 3.87–5.11)
RBC: 4.41 MIL/uL (ref 3.87–5.11)
RDW: 15.5 % (ref 11.5–15.5)
RDW: 15.6 % — ABNORMAL HIGH (ref 11.5–15.5)
WBC: 6.6 10*3/uL (ref 4.0–10.5)
WBC: 7.2 10*3/uL (ref 4.0–10.5)

## 2010-03-31 LAB — POCT URINALYSIS DIPSTICK
Nitrite: NEGATIVE
Protein, ur: 30 mg/dL — AB
pH: 7 (ref 5.0–8.0)

## 2010-03-31 LAB — URINALYSIS, ROUTINE W REFLEX MICROSCOPIC
Glucose, UA: NEGATIVE mg/dL
Hgb urine dipstick: NEGATIVE
Protein, ur: NEGATIVE mg/dL

## 2010-03-31 LAB — DIFFERENTIAL
Basophils Absolute: 0 10*3/uL (ref 0.0–0.1)
Basophils Relative: 0 % (ref 0–1)
Eosinophils Absolute: 0.4 10*3/uL (ref 0.0–0.7)
Eosinophils Relative: 5 % (ref 0–5)
Monocytes Absolute: 0.7 10*3/uL (ref 0.1–1.0)
Neutro Abs: 3.6 10*3/uL (ref 1.7–7.7)

## 2010-03-31 LAB — BASIC METABOLIC PANEL
BUN: 1 mg/dL — ABNORMAL LOW (ref 6–23)
BUN: 1 mg/dL — ABNORMAL LOW (ref 6–23)
CO2: 27 mEq/L (ref 19–32)
Calcium: 8.2 mg/dL — ABNORMAL LOW (ref 8.4–10.5)
Calcium: 8.5 mg/dL (ref 8.4–10.5)
Creatinine, Ser: 0.45 mg/dL (ref 0.4–1.2)
Creatinine, Ser: 0.55 mg/dL (ref 0.4–1.2)
GFR calc Af Amer: >60 mL/min (ref >60)
GFR calc Af Amer: >60 mL/min (ref >60)
Glucose, Bld: 97 mg/dL (ref 70–99)

## 2010-03-31 LAB — COMPREHENSIVE METABOLIC PANEL
Albumin: 3.7 g/dL (ref 3.5–5.2)
Alkaline Phosphatase: 188 U/L — ABNORMAL HIGH (ref 39–117)
BUN: 8 mg/dL (ref 6–23)
Chloride: 102 mEq/L (ref 96–112)
Potassium: 4 mEq/L (ref 3.5–5.1)
Total Bilirubin: 1.9 mg/dL — ABNORMAL HIGH (ref 0.3–1.2)

## 2010-03-31 LAB — TSH: TSH: 0.416 u[IU]/mL (ref 0.350–4.500)

## 2010-06-05 NOTE — Discharge Summary (Signed)
   NAMEMarland Kitchen  Kathryn, Miller                         ACCOUNT NO.:  [id number]   MEDICAL RECORD NO.:  [id number]                   PATIENT TYPE:  INP   LOCATION:  9113                                 FACILITY:  [facility]   PHYSICIAN:  Franklyn Lor, MD                      DATE OF BIRTH:  1965/12/28   DATE OF ADMISSION:  10/09/2002  DATE OF DISCHARGE:  10/12/2002                                 DISCHARGE SUMMARY   HISTORY OF PRESENT ILLNESS:  This is a 46 year old, G4, P3-0-0-3 at 36 and  0/7ths weeks estimated gestational age by second trimester ultrasound  admitted for scheduled cesarean section.  The patient was O positive,  antibody negative, GBS positive, and had been on penicillin for two days  prior to admission.   Dr. Gavin Potters performed low transverse cesarean section, giving rise to a  viable female with Apgars of 9/9.  The placenta was delivered manually intact  with a three vessel cord.  A bilateral tubal ligation was performed at the  patient's request, with estimated blood loss of 800 cc.  No complications.  The patient had decreased urine output postpartum, and was able to tolerate  p.o. in large volumes, eventually had good urine output, and a normal BMET.  She is breast feeding.  The female was circumcised prior to discharge.   CONTRACEPTION:  She is status post bilateral tubal ligation.   Female baby was 7 pounds, 12 ounces at birth.   DISPOSITION:  The patient was discharged home.   FOLLOW UP:  In six weeks at [facility].                                               Franklyn Lor, MD    TD/MEDQ  D:  12/03/2002  T:  12/03/2002  Job:  [id number]

## 2010-06-05 NOTE — Op Note (Signed)
   NAMEMarland Kitchen  Kathryn Miller, Kathryn Miller                         ACCOUNT NO.:  [id number]   MEDICAL RECORD NO.:  [id number]                   PATIENT TYPE:  INP   LOCATION:  9113                                 FACILITY:  [facility]   PHYSICIAN:  Conni Elliot, M.D.             DATE OF BIRTH:  03/15/1965   DATE OF PROCEDURE:  10/09/2002  DATE OF DISCHARGE:                                 OPERATIVE REPORT   PREOPERATIVE DIAGNOSES:  1. Three prior cesareans, request repeat.  2. Desire for surgical sterilization.   POSTOPERATIVE DIAGNOSES:  1. Three prior cesareans, request repeat.  2. Desire for surgical sterilization.   OPERATIONS:  1. Low transverse cesarean.  2. Modified bilateral Pomeroy tubal ligation.   SURGEON:  Conni Elliot, M.D. and Dr. Morrie Sheldon.   ANESTHESIA:  Continuous lumbar Epidural.   OPERATIVE FINDINGS:  Female infant with Apgars of 9/9.  Placenta sent to  pathology, amniotic fluid was clear.   PROCEDURE:  After bringing continuous lumbar epidural anesthetic to an  operative level, placing the patient supine, left lateral tilt position, the  abdomen was prepped and draped in sterile fashion.  A low transverse  Pfannenstiel incision was made.  The incision was made through the skin and  fascia.  Rectus muscles opened in the midline.  Peritoneum cavity entered  and bladder flap created.  A low transverse uterine incision was made.  The  baby was delivered from a vertex presentation.  Cord doubly clamped and cut.  The baby handed to the neonatologist in attendance.  The placenta was  delivered spontaneously.  The uterus is closed in routine fashion.  There  was not a bladder flap to reapproximate due to the prior scarring.  The left  fallopian tube was identified, grasped with a Babcock clamp, followed to its  fimbriated end.  A segment of tube was brought into the operative field,  double suture ligated.  Approximately a 1.5-2 cm segment was excised.  Hemostasis was adequate.  Same  procedure done on the opposite side.  Hemostasis was adequate.  The anterior peritoneum, fascia, subcutaneous skin  closed in routine fashion.  The estimated blood loss was less than 800 mL.  The instrument count was correct.                                               Conni Elliot, M.D.    ASG/MEDQ  D:  10/09/2002  T:  10/09/2002  Job:  [id number]

## 2010-09-28 ENCOUNTER — Inpatient Hospital Stay (INDEPENDENT_AMBULATORY_CARE_PROVIDER_SITE_OTHER)
Admission: RE | Admit: 2010-09-28 | Discharge: 2010-09-28 | Disposition: A | Discharge status: Home / Self Care | Payer: Self-pay | Source: Ambulatory Visit | Attending: Emergency Medicine | Admitting: Emergency Medicine

## 2010-09-28 DIAGNOSIS — J40 Bronchitis, not specified as acute or chronic: Secondary | ICD-10-CM

## 2010-11-30 ENCOUNTER — Encounter: Payer: Self-pay | Admitting: Cardiology

## 2010-11-30 ENCOUNTER — Emergency Department (INDEPENDENT_AMBULATORY_CARE_PROVIDER_SITE_OTHER)
Admission: EM | Admit: 2010-11-30 | Discharge: 2010-11-30 | Disposition: A | Discharge status: Home / Self Care | Payer: Self-pay | Source: Home / Self Care

## 2010-11-30 DIAGNOSIS — J40 Bronchitis, not specified as acute or chronic: Secondary | ICD-10-CM

## 2010-11-30 MED ORDER — PREDNISONE 20 MG PO TABS: 20.0000 mg | ORAL_TABLET | Freq: Every day | ORAL | Status: AC

## 2010-11-30 MED ORDER — ALBUTEROL SULFATE (5 MG/ML) 0.5% IN NEBU
5.0000 mg | INHALATION_SOLUTION | Freq: Once | RESPIRATORY_TRACT | Status: AC
  Administered 2010-11-30: 5 mg via RESPIRATORY_TRACT

## 2010-11-30 MED ORDER — ALBUTEROL SULFATE HFA 108 (90 BASE) MCG/ACT IN AERS: 2.0000 | INHALATION_SPRAY | RESPIRATORY_TRACT | Status: DC | PRN

## 2010-11-30 MED ORDER — BENZONATATE 100 MG PO CAPS: ORAL_CAPSULE | ORAL | Status: DC

## 2010-11-30 MED ORDER — IPRATROPIUM BROMIDE 0.02 % IN SOLN
0.5000 mg | Freq: Once | RESPIRATORY_TRACT | Status: AC
  Administered 2010-11-30: 0.5 mg via RESPIRATORY_TRACT

## 2010-11-30 MED ORDER — ALBUTEROL SULFATE (5 MG/ML) 0.5% IN NEBU
INHALATION_SOLUTION | RESPIRATORY_TRACT | Status: AC
  Filled 2010-11-30: qty 0.5

## 2010-11-30 NOTE — ED Provider Notes (Signed)
History     CSN: [id number] Arrival date & time: 11/30/2010  6:16 PM   None     Chief Complaint  Patient presents with  . Cough  . Shortness of Breath    (Consider location/radiation/quality/duration/timing/severity/associated sxs/prior treatment) Patient is a 45 y.o. female presenting with cough. The history is provided by the patient.  Cough This is a new problem. The current episode started more than 2 days ago. The problem occurs constantly. The problem has not changed since onset.The cough is non-productive. There has been no fever. Associated symptoms include chest pain (Rt lateral chest pain with cough, lying on Rt side and deep breath), headaches (Rt frontal - with cough) and wheezing. Pertinent negatives include no chills, no ear pain, no rhinorrhea, no sore throat and no shortness of breath. She has tried cough syrup for the symptoms. The treatment provided no relief. She is not a smoker. Her past medical history does not include asthma.    History reviewed. No pertinent past medical history.  Past Surgical History  Procedure Date  . Cesarean section [phone]  . Tubal ligation 2004    History reviewed. No pertinent family history.  History  Substance Use Topics  . Smoking status: Never Smoker   . Smokeless tobacco: Not on file  . Alcohol Use: No    OB History    Grav Para Term Preterm Abortions TAB SAB Ect Mult Living                  Review of Systems  Constitutional: Negative for fever, chills and fatigue.  HENT: Positive for congestion. Negative for ear pain, sore throat, rhinorrhea, sneezing, postnasal drip and sinus pressure.   Respiratory: Positive for cough and wheezing. Negative for shortness of breath.   Cardiovascular: Positive for chest pain (Rt lateral chest pain with cough, lying on Rt side and deep breath). Negative for palpitations.  Gastrointestinal: Negative for nausea, vomiting and abdominal pain.  Neurological: Positive for  headaches (Rt frontal - with cough).    Allergies  Review of patient's allergies indicates no known allergies.  Home Medications   Current Outpatient Rx  Name Route Sig Dispense Refill  . IBUPROFEN 600 MG PO TABS Oral Take 600 mg by mouth every 6 (six) hours as needed.        BP 139/79  Pulse 93  Temp(Src) 98.7 F (37.1 C) (Oral)  Resp 22  SpO2 99%  LMP 11/26/2010  Physical Exam  Nursing note and vitals reviewed. Constitutional: She appears well-developed and well-nourished. No distress.  HENT:  Head: Normocephalic and atraumatic.  Right Ear: Tympanic membrane, external ear and ear canal normal.  Left Ear: Tympanic membrane, external ear and ear canal normal.  Nose: Nose normal.  Mouth/Throat: Uvula is midline, oropharynx is clear and moist and mucous membranes are normal. No oropharyngeal exudate, posterior oropharyngeal edema or posterior oropharyngeal erythema.  Neck: Neck supple.  Cardiovascular: Normal rate, regular rhythm and normal heart sounds.   Pulmonary/Chest: Effort normal. No respiratory distress. She has decreased breath sounds in the left lower field. She has no wheezes. She has no rales.  Lymphadenopathy:    She has no cervical adenopathy.  Neurological: She is alert.  Skin: Skin is warm and dry.  Psychiatric: She has a normal mood and affect.    ED Course  Procedures (including critical care time)  Labs Reviewed - No data to display No results found.   No diagnosis found.    MDM  Symptomatic and clinical  improvement with NMT - No cough and lungs CTA.        Melody Comas, [place] 11/30/10 1932

## 2010-11-30 NOTE — ED Notes (Signed)
Pt reports has had non-productive dry cough since Friday. C/O soreness to right side of face and headache. Pt co right rib pain with deep breathing and when laying on the right side.  Diminished breath sounds to bilat lower lung fields.

## 2011-07-23 ENCOUNTER — Encounter (HOSPITAL_COMMUNITY): Payer: Self-pay | Admitting: Emergency Medicine

## 2011-07-23 ENCOUNTER — Emergency Department (HOSPITAL_COMMUNITY): Payer: Self-pay

## 2011-07-23 ENCOUNTER — Emergency Department (HOSPITAL_COMMUNITY)
Admission: EM | Admit: 2011-07-23 | Discharge: 2011-07-23 | Disposition: A | Discharge status: Home / Self Care | Payer: Self-pay | Attending: Emergency Medicine | Admitting: Emergency Medicine

## 2011-07-23 DIAGNOSIS — R0789 Other chest pain: Secondary | ICD-10-CM

## 2011-07-23 DIAGNOSIS — R071 Chest pain on breathing: Secondary | ICD-10-CM | POA: Insufficient documentation

## 2011-07-23 DIAGNOSIS — M19019 Primary osteoarthritis, unspecified shoulder: Secondary | ICD-10-CM | POA: Insufficient documentation

## 2011-07-23 DIAGNOSIS — M25511 Pain in right shoulder: Secondary | ICD-10-CM

## 2011-07-23 HISTORY — DX: Cardiac murmur, unspecified: R01.1

## 2011-07-23 LAB — POCT I-STAT TROPONIN I: Troponin i, poc: 0 ng/mL (ref 0.00–0.08)

## 2011-07-23 LAB — BASIC METABOLIC PANEL
Calcium: 9.1 mg/dL (ref 8.4–10.5)
GFR calc non Af Amer: >90 mL/min (ref >90)
Glucose, Bld: 90 mg/dL (ref 70–99)
Potassium: 3.8 mEq/L (ref 3.5–5.1)
Sodium: 136 mEq/L (ref 135–145)

## 2011-07-23 LAB — CBC
Hemoglobin: 12.2 g/dL (ref 12.0–15.0)
MCHC: 34.6 g/dL (ref 30.0–36.0)
Platelets: 256 10*3/uL (ref 150–400)
RBC: 4.64 MIL/uL (ref 3.87–5.11)

## 2011-07-23 MED ORDER — IBUPROFEN 600 MG PO TABS: 600.0000 mg | ORAL_TABLET | Freq: Four times a day (QID) | ORAL | Status: AC | PRN

## 2011-07-23 MED ORDER — OXYCODONE-ACETAMINOPHEN 5-325 MG PO TABS
1.0000 | ORAL_TABLET | Freq: Once | ORAL | Status: AC
  Administered 2011-07-23: 1 via ORAL
  Filled 2011-07-23: qty 1

## 2011-07-23 NOTE — ED Notes (Signed)
Dr. Linker is at the bedside 

## 2011-07-23 NOTE — ED Notes (Addendum)
Pt presented to the ER with complaint of chest discomfort characterized as sharp on and off since Wednesday that got worse this morning worse when laying down and when she moves a certain way. Pt denies any cough nor fever.

## 2011-07-23 NOTE — ED Notes (Addendum)
PT has midsternal chest pain for several days, thought it was gas, woke up and it is worse. No nausea; mildly diaphoretic at first. Laying flat makes it worse; deep breathe makes it worse. Non smoker.

## 2011-07-23 NOTE — ED Provider Notes (Signed)
History     CSN: [id number]  Arrival date & time 07/23/11  1478   First MD Initiated Contact with Patient 07/23/11 0710      Chief Complaint  Patient presents with  . Chest Pain    (Consider location/radiation/quality/duration/timing/severity/associated sxs/prior treatment) HPI Pt presenting with c/o midsternal chest pain., also left shoulder pain.  Pain is worsened by certain positions and movement.  Chest pain has been constant x 2 days.  No sob, nausea, no radiation of pain. No leg swelling, no hx DVT/PE, no recent travel/trauma/surgery.  Pt has not tried anything for pain prior to arrival.  There are no other alleviating or modifying factors, there are no other associated systemic symptoms.   Past Medical History  Diagnosis Date  . Murmur, cardiac     Past Surgical History  Procedure Date  . Cesarean section [phone]  . Tubal ligation 2004    No family history on file.  History  Substance Use Topics  . Smoking status: Never Smoker   . Smokeless tobacco: Not on file  . Alcohol Use: No    OB History    Grav Para Term Preterm Abortions TAB SAB Ect Mult Living                  Review of Systems ROS reviewed and all otherwise negative except for mentioned in HPI  Allergies  Review of patient's allergies indicates no known allergies.  Home Medications   Current Outpatient Rx  Name Route Sig Dispense Refill  . ALBUTEROL SULFATE HFA 108 (90 BASE) MCG/ACT IN AERS Inhalation Inhale 2 puffs into the lungs every 4 (four) hours as needed for wheezing. 1 Inhaler 0  . IBUPROFEN 600 MG PO TABS Oral Take 600 mg by mouth every 6 (six) hours as needed. For pain    . IBUPROFEN 600 MG PO TABS Oral Take 1 tablet (600 mg total) by mouth every 6 (six) hours as needed for pain. 30 tablet 0    BP 125/80  Pulse 70  Temp 98.4 F (36.9 C) (Oral)  Resp 16  SpO2 100%  LMP 06/24/2011 Vitals reviewed Physical Exam Physical Examination: General appearance - alert, well  appearing, and in no distress Mental status - alert, oriented to person, place, and time Eyes - pupils equal and reactive, no conjunctival injection or scleral icterus Mouth - mucous membranes moist, pharynx normal without lesions Chest - clear to auscultation, no wheezes, rales or rhonchi, symmetric air entry, left upper chest wall tenderness to palpation Heart - normal rate, regular rhythm, normal S1, S2, no murmurs, rubs, clicks or gallops Abdomen - soft, nontender, nondistended, no masses or organomegaly Musculoskeletal - mild ttp over left anterior shoulder- some pain with ROM, otherwise no joint tenderness, deformity or swelling Extremities - peripheral pulses normal, no pedal edema, no clubbing or cyanosis Skin - normal coloration and turgor, no rashes Psych-normal mood and affect ED Course  Procedures (including critical care time)  Date: 07/23/2011  Rate: 78  Rhythm: normal sinus rhythm  QRS Axis: normal  Intervals: normal  ST/T Wave abnormalities: normal  Conduction Disutrbances: none  Narrative Interpretation: unremarkable     Labs Reviewed  CBC - Abnormal; Notable for the following:    HCT 35.3 (*)     MCV 76.1 (*)     RDW 15.8 (*)     All other components within normal limits  BASIC METABOLIC PANEL  POCT I-STAT TROPONIN I  LAB REPORT - SCANNED   Dg Chest 2  View  07/23/2011  *RADIOLOGY REPORT*  Clinical Data: Severe right-sided chest pain with shortness of breath and cough.  CHEST - 2 VIEW  Comparison: 12/03/2004  Findings: The patient has some slight bibasilar atelectasis, right greater than left.  Heart size and pulmonary vascularity are normal.  No pneumothorax. No effusions.  No acute osseous abnormality. Slight thoracolumbar scoliosis.  IMPRESSION: Bibasilar atelectasis, right greater than left.  Original Report Authenticated By: Gwynn Burly, M.D.   Dg Shoulder Right  07/23/2011  *RADIOLOGY REPORT*  Clinical Data: Right shoulder pain.  RIGHT SHOULDER - 2+  VIEW  Comparison: None.  Findings: There is osteoarthritis of the acromioclavicular joint. Glenohumeral joint is essentially normal.  No soft tissue calcifications.  No fractures or dislocation.  IMPRESSION: No acute abnormalities.  Slight osteoarthritis of the acromioclavicular joint.  Original Report Authenticated By: Gwynn Burly, M.D.     1. Chest wall pain   2. Right shoulder pain       MDM  Pt presenting with reproducible right sided chest pain as well as right shoulder pain- worse with palpation and movement.  xrays show osteoarthritis of AC joint.  Labs reassuring.  Low suspicion for ACS, PERC score 0.  Discharged with strict return precautions, pt is agreeable with this plan.        Ethelda Chick, MD 07/24/11 [phone]

## 2011-12-07 ENCOUNTER — Emergency Department (INDEPENDENT_AMBULATORY_CARE_PROVIDER_SITE_OTHER)
Admission: EM | Admit: 2011-12-07 | Discharge: 2011-12-07 | Disposition: A | Discharge status: Home / Self Care | Payer: Self-pay | Source: Home / Self Care | Attending: Family Medicine | Admitting: Family Medicine

## 2011-12-07 ENCOUNTER — Encounter (HOSPITAL_COMMUNITY): Payer: Self-pay | Admitting: *Deleted

## 2011-12-07 DIAGNOSIS — R059 Cough, unspecified: Secondary | ICD-10-CM

## 2011-12-07 DIAGNOSIS — R05 Cough: Secondary | ICD-10-CM

## 2011-12-07 DIAGNOSIS — J069 Acute upper respiratory infection, unspecified: Secondary | ICD-10-CM

## 2011-12-07 MED ORDER — CETIRIZINE HCL 10 MG PO CHEW: 10.0000 mg | CHEWABLE_TABLET | Freq: Every day | ORAL | Status: DC

## 2011-12-07 MED ORDER — SALINE NASAL SPRAY 0.65 % NA SOLN: 1.0000 | NASAL | Status: DC | PRN

## 2011-12-07 MED ORDER — ALBUTEROL SULFATE HFA 108 (90 BASE) MCG/ACT IN AERS: 2.0000 | INHALATION_SPRAY | RESPIRATORY_TRACT | Status: DC | PRN

## 2011-12-07 MED ORDER — OMEPRAZOLE 40 MG PO CPDR: 40.0000 mg | DELAYED_RELEASE_CAPSULE | Freq: Every day | ORAL | Status: DC

## 2011-12-07 NOTE — ED Provider Notes (Signed)
History     CSN: [id number]  Arrival date & time 12/07/11  1330   First MD Initiated Contact with Patient 12/07/11 1356      Chief Complaint  Patient presents with  . Cough    (Consider location/radiation/quality/duration/timing/severity/associated sxs/prior treatment) Patient is a 46 y.o. female presenting with cough. The history is provided by the patient.  Cough This is a recurrent problem. The current episode started more than 1 week ago (one month). The problem has not changed since onset.The cough is non-productive. There has been no fever. Associated symptoms include ear congestion, ear pain, rhinorrhea and sore throat. Pertinent negatives include no shortness of breath and no wheezing. She has tried cough syrup (albuterol) for the symptoms. The treatment provided no relief. She is not a smoker. Her past medical history is significant for bronchitis and asthma. Her past medical history does not include pneumonia.    Past Medical History  Diagnosis Date  . Murmur, cardiac     Past Surgical History  Procedure Date  . Cesarean section [phone]  . Tubal ligation 2004    No family history on file.  History  Substance Use Topics  . Smoking status: Never Smoker   . Smokeless tobacco: Not on file  . Alcohol Use: No    OB History    Grav Para Term Preterm Abortions TAB SAB Ect Mult Living                  Review of Systems  HENT: Positive for ear pain, congestion, sore throat, rhinorrhea and sinus pressure.   Respiratory: Positive for cough. Negative for shortness of breath and wheezing.   Gastrointestinal: Positive for nausea.  All other systems reviewed and are negative.    Allergies  Review of patient's allergies indicates no known allergies.  Home Medications   Current Outpatient Rx  Name  Route  Sig  Dispense  Refill  . ALBUTEROL SULFATE HFA 108 (90 BASE) MCG/ACT IN AERS   Inhalation   Inhale 2 puffs into the lungs every 4 (four) hours as  needed for wheezing.   1 Inhaler   3   . CETIRIZINE HCL 10 MG PO CHEW   Oral   Chew 1 tablet (10 mg total) by mouth daily.   30 tablet   0   . IBUPROFEN 600 MG PO TABS   Oral   Take 600 mg by mouth every 6 (six) hours as needed. For pain         . OMEPRAZOLE 40 MG PO CPDR   Oral   Take 1 capsule (40 mg total) by mouth daily.   30 capsule   2   . SALINE NASAL SPRAY 0.65 % NA SOLN   Nasal   Place 1 spray into the nose as needed for congestion.   30 mL   12     BP 121/79  Pulse 76  Temp 98.5 F (36.9 C) (Oral)  Resp 20  SpO2 99%  Physical Exam  Nursing note and vitals reviewed. Constitutional: She is oriented to person, place, and time. Vital signs are normal. She appears well-developed and well-nourished. She is active and cooperative.  HENT:  Head: Normocephalic.  Right Ear: External ear normal.  Left Ear: External ear normal.  Nose: Nose normal.  Mouth/Throat: Oropharynx is clear and moist. No oropharyngeal exudate.  Eyes: Conjunctivae normal are normal. Pupils are equal, round, and reactive to light. No scleral icterus.  Neck: Trachea normal and normal range of motion.  Neck supple.  Cardiovascular: Normal rate and regular rhythm.   Murmur heard. Pulmonary/Chest: Effort normal and breath sounds normal.  Abdominal: Soft. Bowel sounds are normal. There is no tenderness.  Musculoskeletal: Normal range of motion.  Lymphadenopathy:    She has no cervical adenopathy.  Neurological: She is alert and oriented to person, place, and time. No cranial nerve deficit or sensory deficit. Coordination normal.  Skin: Skin is warm and dry. No rash noted.  Psychiatric: She has a normal mood and affect. Her speech is normal and behavior is normal. Judgment and thought content normal. Cognition and memory are normal.    ED Course  Procedures (including critical care time)  Labs Reviewed - No data to display No results found.   1. Cough   2. URI (upper respiratory  infection)       MDM  Increase fluid intake, rest.  Antibiotics not indicated.  Begin expectorant/decongestant, topical decongestant, saline nasal spray and/or saline irrigation, and cough suppressant at bedtime. Antihistamines of your choice (Claritin or Zyrtec).  Tylenol or Motrin for fever/discomfort.  Followup with PCP if not improving 7 to 10 days.        Johnsie Kindred, NP 12/07/11 1530

## 2011-12-07 NOTE — ED Notes (Signed)
Pt  Has  History  Of  Asthma       Reports  Symptoms  Of  Cough  /  Congested            As  Well         As        sorethroat  And       Scratchy           Throat         Pt      Is  Awake  And  Alert   And  Oriented         Speaking  In  Complete  sentances

## 2011-12-07 NOTE — ED Provider Notes (Signed)
Medical screening examination/treatment/procedure(s) were performed by resident physician or non-physician practitioner and as supervising physician I was immediately available for consultation/collaboration.   Barkley Bruns MD.    Linna Hoff, MD 12/07/11 2137

## 2012-11-05 ENCOUNTER — Emergency Department (HOSPITAL_COMMUNITY)
Admission: EM | Admit: 2012-11-05 | Discharge: 2012-11-05 | Disposition: A | Discharge status: Home / Self Care | Payer: Self-pay | Attending: Emergency Medicine | Admitting: Emergency Medicine

## 2012-11-05 ENCOUNTER — Emergency Department (HOSPITAL_COMMUNITY): Payer: Self-pay

## 2012-11-05 ENCOUNTER — Encounter (HOSPITAL_COMMUNITY): Payer: Self-pay | Admitting: Emergency Medicine

## 2012-11-05 DIAGNOSIS — F439 Reaction to severe stress, unspecified: Secondary | ICD-10-CM

## 2012-11-05 DIAGNOSIS — F430 Acute stress reaction: Secondary | ICD-10-CM | POA: Insufficient documentation

## 2012-11-05 DIAGNOSIS — M79609 Pain in unspecified limb: Secondary | ICD-10-CM | POA: Insufficient documentation

## 2012-11-05 DIAGNOSIS — R6884 Jaw pain: Secondary | ICD-10-CM | POA: Insufficient documentation

## 2012-11-05 DIAGNOSIS — Z79899 Other long term (current) drug therapy: Secondary | ICD-10-CM | POA: Insufficient documentation

## 2012-11-05 DIAGNOSIS — R011 Cardiac murmur, unspecified: Secondary | ICD-10-CM | POA: Insufficient documentation

## 2012-11-05 LAB — CBC WITH DIFFERENTIAL/PLATELET
Eosinophils Absolute: 0.2 10*3/uL (ref 0.0–0.7)
Eosinophils Relative: 2 % (ref 0–5)
HCT: 32.1 % — ABNORMAL LOW (ref 36.0–46.0)
Lymphocytes Relative: 34 % (ref 12–46)
Lymphs Abs: 2.8 10*3/uL (ref 0.7–4.0)
MCH: 26.4 pg (ref 26.0–34.0)
MCV: 76.2 fL — ABNORMAL LOW (ref 78.0–100.0)
Monocytes Absolute: 0.5 10*3/uL (ref 0.1–1.0)
Platelets: 255 10*3/uL (ref 150–400)
RBC: 4.21 MIL/uL (ref 3.87–5.11)

## 2012-11-05 LAB — BASIC METABOLIC PANEL
BUN: 19 mg/dL (ref 6–23)
CO2: 26 mEq/L (ref 19–32)
Calcium: 9.2 mg/dL (ref 8.4–10.5)
Creatinine, Ser: 0.6 mg/dL (ref 0.50–1.10)
GFR calc non Af Amer: >90 mL/min (ref >90)
Glucose, Bld: 85 mg/dL (ref 70–99)
Sodium: 136 mEq/L (ref 135–145)

## 2012-11-05 NOTE — ED Notes (Signed)
States jaw feels tight, front of tongue feels numb, back of tongue tingly, also c/o right arm pain intermittently.

## 2012-11-05 NOTE — ED Provider Notes (Signed)
CSN: [id number]     Arrival date & time 11/05/12  1750 History   First MD Initiated Contact with Patient 11/05/12 1805     CC: Arm pain and numbness  Patient is a 47 y.o. female presenting with arm pain.  Arm Pain   Pt complains of some tightness in her jaw as well as tingling in her tongue.  She has also had a pain and tightness in her right arm for the last month.  She has also had trouble with a sharp pain on the sides of her head at nights when she is lying down. These symptoms were initially off and on but for the last two weeks they have been more constant.  Stress seems to make it worse.  Nothing makes it better. No history of heart problems.  No DVT or PE.  No smoking. Past Medical History  Diagnosis Date  . Murmur, cardiac    Past Surgical History  Procedure Laterality Date  . Cesarean section  [phone]  . Tubal ligation  2004   History reviewed. No pertinent family history. History  Substance Use Topics  . Smoking status: Never Smoker   . Smokeless tobacco: Not on file  . Alcohol Use: No   OB History   Grav Para Term Preterm Abortions TAB SAB Ect Mult Living                 Review of Systems  All other systems reviewed and are negative.    Allergies  Review of patient's allergies indicates no known allergies.  Home Medications   Current Outpatient Rx  Name  Route  Sig  Dispense  Refill  . albuterol (PROVENTIL HFA;VENTOLIN HFA) 108 (90 BASE) MCG/ACT inhaler   Inhalation   Inhale 2 puffs into the lungs every 4 (four) hours as needed for wheezing.   1 Inhaler   3   . ibuprofen (ADVIL,MOTRIN) 600 MG tablet   Oral   Take 600 mg by mouth every 6 (six) hours as needed. For pain          BP 148/73  Pulse 72  Temp(Src) 98.8 F (37.1 C) (Oral)  Resp 20  SpO2 99%  LMP 10/18/2012 Physical Exam  Nursing note and vitals reviewed. Constitutional: She appears well-developed and well-nourished. No distress.  HENT:  Head: Normocephalic and  atraumatic.  Right Ear: External ear normal.  Left Ear: External ear normal.  Eyes: Conjunctivae are normal. Right eye exhibits no discharge. Left eye exhibits no discharge. No scleral icterus.  Neck: Neck supple. No tracheal deviation present.  Cardiovascular: Normal rate, regular rhythm and intact distal pulses.   Pulmonary/Chest: Effort normal and breath sounds normal. No stridor. No respiratory distress. She has no wheezes. She has no rales.  Abdominal: Soft. Bowel sounds are normal. She exhibits no distension. There is no tenderness. There is no rebound and no guarding.  Musculoskeletal: She exhibits no edema and no tenderness.  Neurological: She is alert. She has normal strength. No sensory deficit. Cranial nerve deficit:  no gross defecits noted. She exhibits normal muscle tone. She displays no seizure activity. Coordination normal.  Skin: Skin is warm and dry. No rash noted.  Psychiatric: She has a normal mood and affect.    ED Course  Procedures (including critical care time) Labs Review Labs Reviewed  CBC WITH DIFFERENTIAL - Abnormal; Notable for the following:    Hemoglobin 11.1 (*)    HCT 32.1 (*)    MCV 76.2 (*)  RDW 15.6 (*)    All other components within normal limits  BASIC METABOLIC PANEL - Abnormal; Notable for the following:    Potassium 3.3 (*)    All other components within normal limits  POCT I-STAT TROPONIN I   Imaging Review Dg Chest 2 View  11/05/2012   CLINICAL DATA:  Weakness. Chest pain.  EXAM: CHEST  2 VIEW  COMPARISON:  07/23/2011.  FINDINGS: The pulmonary aeration has improved. Bibasilar atelectasis has resolved. There is no confluent airspace opacity or pleural effusion. The heart size and mediastinal contours are stable. Telemetry leads overlie the chest.  IMPRESSION: Interval resolution of bibasilar atelectasis. No acute cardiopulmonary process identified.   Electronically Signed   By: Roxy Horseman M.D.   On: 11/05/2012 18:52    EKG  Interpretation     Ventricular Rate:  67 PR Interval:  144 QRS Duration: 90 QT Interval:  376 QTC Calculation: 397 R Axis:   59 Text Interpretation:  Sinus rhythm Normal ECG No significant change since last tracing            MDM   1. Jaw pain   2. Stress    Patient does admit to a lot of stressors at home. Her symptoms are atypical for cardiac disease. She is low risk. Her EKG and cardiac markers are normal.  Symptoms may be related to stress or possible GERD.  At this time there does not appear to be any evidence of an acute emergency medical condition and the patient appears stable for discharge with appropriate outpatient follow up.     Celene Kras, MD 11/05/12 [phone]

## 2012-11-05 NOTE — ED Notes (Signed)
She tells me that for the past month, she has had nocturnal headaches coupled with right shoulder and arm "aching".  She states the frequency and intensity of these headaches have increased for past two weeks.  She states that today as she was folding laundry this morning, she felt paresthesias in the back of her tongues/post. Oropharynx, which have persisted throughout the day and still persist.  She denies feeling any throat swelling or pressure, and she denies having any breathing difficulty and is in non distress.

## 2013-07-18 ENCOUNTER — Encounter (HOSPITAL_COMMUNITY): Payer: Self-pay | Admitting: Emergency Medicine

## 2013-07-18 ENCOUNTER — Emergency Department (HOSPITAL_COMMUNITY)
Admission: EM | Admit: 2013-07-18 | Discharge: 2013-07-18 | Disposition: A | Discharge status: Home / Self Care | Payer: BC Managed Care – PPO | Attending: Emergency Medicine | Admitting: Emergency Medicine

## 2013-07-18 DIAGNOSIS — Z3202 Encounter for pregnancy test, result negative: Secondary | ICD-10-CM | POA: Insufficient documentation

## 2013-07-18 DIAGNOSIS — H60399 Other infective otitis externa, unspecified ear: Secondary | ICD-10-CM | POA: Insufficient documentation

## 2013-07-18 DIAGNOSIS — R109 Unspecified abdominal pain: Secondary | ICD-10-CM | POA: Insufficient documentation

## 2013-07-18 DIAGNOSIS — J40 Bronchitis, not specified as acute or chronic: Secondary | ICD-10-CM | POA: Insufficient documentation

## 2013-07-18 DIAGNOSIS — R011 Cardiac murmur, unspecified: Secondary | ICD-10-CM | POA: Insufficient documentation

## 2013-07-18 DIAGNOSIS — H6093 Unspecified otitis externa, bilateral: Secondary | ICD-10-CM

## 2013-07-18 LAB — URINE MICROSCOPIC-ADD ON

## 2013-07-18 LAB — PREGNANCY, URINE: Preg Test, Ur: NEGATIVE

## 2013-07-18 LAB — URINALYSIS, ROUTINE W REFLEX MICROSCOPIC
BILIRUBIN URINE: NEGATIVE
GLUCOSE, UA: NEGATIVE mg/dL
KETONES UR: NEGATIVE mg/dL
Leukocytes, UA: NEGATIVE
NITRITE: NEGATIVE
PH: 6 (ref 5.0–8.0)
Protein, ur: NEGATIVE mg/dL
SPECIFIC GRAVITY, URINE: 1.026 (ref 1.005–1.030)
Urobilinogen, UA: 1 mg/dL (ref 0.0–1.0)

## 2013-07-18 MED ORDER — CIPROFLOXACIN-DEXAMETHASONE 0.3-0.1 % OT SUSP
4.0000 [drp] | OTIC | Status: AC
  Administered 2013-07-18: 4 [drp] via OTIC
  Filled 2013-07-18: qty 7.5

## 2013-07-18 MED ORDER — METOCLOPRAMIDE HCL 10 MG PO TABS
10.0000 mg | ORAL_TABLET | Freq: Once | ORAL | Status: AC
  Administered 2013-07-18: 10 mg via ORAL
  Filled 2013-07-18: qty 1

## 2013-07-18 MED ORDER — ALBUTEROL SULFATE (2.5 MG/3ML) 0.083% IN NEBU
5.0000 mg | INHALATION_SOLUTION | Freq: Once | RESPIRATORY_TRACT | Status: AC
  Administered 2013-07-18: 5 mg via RESPIRATORY_TRACT
  Filled 2013-07-18: qty 6

## 2013-07-18 MED ORDER — DEXTROMETHORPHAN HBR 15 MG/5ML PO SYRP: 10.0000 mL | ORAL_SOLUTION | Freq: Four times a day (QID) | ORAL | Status: DC | PRN

## 2013-07-18 NOTE — ED Notes (Signed)
AVS in hand-explained in detail. Patient has ear drops with her. No other questions or concerns. Ambulatory with steady gait. In NAD. Breathing unlabored and regular.

## 2013-07-18 NOTE — ED Notes (Addendum)
Pt c/o RLQ abdominal pain x nausea x 4 days and bilateral ear pain, sore throat and cough x 6 days.  Pain score 5/10 and 7/10, respectively.  Pt reports taking OTC medication w/o relief.

## 2013-07-18 NOTE — ED Provider Notes (Signed)
CSN: [id number]     Arrival date & time 07/18/13  0732 History   First MD Initiated Contact with Patient 07/18/13 [id number]     Chief Complaint  Patient presents with  . Abdominal Pain  . Otalgia  . Cough     (Consider location/radiation/quality/duration/timing/severity/associated sxs/prior Treatment) The history is provided by the patient.  Kathryn Miller is a 48 y.o. female here with ear pain, sore throat, cough, ab pain, nausea. Symptoms for the last 4 days. Mainly ear pain. Denies purulent discharge from ear. Denies fever. Has some swelling proximal neck as well and sore throat. Some nausea, no vomiting. She is just finishing her period and had some lower abdominal cramps, sometimes on the right but also on the left. No diarrhea, + urinary frequency but no dysuria. Also has nonproductive cough.    Past Medical History  Diagnosis Date  . Murmur, cardiac    Past Surgical History  Procedure Laterality Date  . Cesarean section  [phone],1996,1988,1986  . Tubal ligation  2004   History reviewed. No pertinent family history. History  Substance Use Topics  . Smoking status: Never Smoker   . Smokeless tobacco: Not on file  . Alcohol Use: No   OB History   Grav Para Term Preterm Abortions TAB SAB Ect Mult Living                 Review of Systems  HENT: Positive for ear pain.   Respiratory: Positive for cough.   Gastrointestinal: Positive for nausea and abdominal pain.  All other systems reviewed and are negative.     Allergies  Review of patient's allergies indicates no known allergies.  Home Medications   Prior to Admission medications   Medication Sig Start Date End Date Taking? Authorizing Provider  albuterol (PROVENTIL HFA;VENTOLIN HFA) 108 (90 BASE) MCG/ACT inhaler Inhale 2 puffs into the lungs every 4 (four) hours as needed for wheezing. 12/07/11 07/18/13 Yes Johnsie Kindredarmen L Chatten, NP  ibuprofen (ADVIL,MOTRIN) 200 MG tablet Take 800 mg by mouth every 6 (six) hours as needed for  moderate pain.   Yes Historical Provider, MD   BP 138/86  Pulse 75  Temp(Src) 98.3 F (36.8 C) (Oral)  Resp 16  SpO2 100%  LMP 07/13/2013 Physical Exam  Nursing note and vitals reviewed. Constitutional: She is oriented to person, place, and time. She appears well-developed and well-nourished.  HENT:  Head: Normocephalic.  Mouth/Throat: Oropharynx is clear and moist.  Bilateral TM nl. ? Mild otitis externa but canal is relatively open.   Eyes: Conjunctivae and EOM are normal. Pupils are equal, round, and reactive to light.  Neck: Normal range of motion.  Mild bilateral cervical LAD. No stridor   Cardiovascular: Normal rate, regular rhythm and normal heart sounds.   Pulmonary/Chest: Effort normal and breath sounds normal. No respiratory distress. She has no wheezes. She has no rales.  No wheezing   Abdominal: Soft. Bowel sounds are normal.  Mild bilateral lower abdominal tenderness with no rebound. When distracted, nontender abdomen.   Musculoskeletal: Normal range of motion. She exhibits no edema and no tenderness.  Neurological: She is alert and oriented to person, place, and time. No cranial nerve deficit. Coordination normal.  Skin: Skin is warm and dry.  Psychiatric: She has a normal mood and affect. Her behavior is normal. Judgment and thought content normal.    ED Course  Procedures (including critical care time) Labs Review Labs Reviewed  URINALYSIS, ROUTINE W REFLEX MICROSCOPIC - Abnormal; Notable for  the following:    Hgb urine dipstick TRACE (*)    All other components within normal limits  PREGNANCY, URINE  URINE MICROSCOPIC-ADD ON    Imaging Review No results found.   EKG Interpretation None      MDM   Final diagnoses:  None    Kathryn Miller is a 48 y.o. female here with ear pain, sore throat, nausea, ab pain. Likely viral syndrome. Also possibly has otitis externa. I think ab pain can also be cramps during period and I doubt appy (no fever,  nontender when distracted). Will give ciprodex, tussionex, prn motrin and tylenol.      Richardean Canalavid H Yao, MD 07/18/13 [phone]

## 2013-07-18 NOTE — ED Notes (Signed)
Initial contact-A&Ox4. Ambulatory, moving all extremities. Plan of care explained to patient. MD Silverio LayYao at bedside. In NAD. No other complaints or needs at this time.

## 2013-07-18 NOTE — Discharge Instructions (Signed)
Use ciprodex drops to both ears twice a day for a week.   Take tussionex for cough.   Stay hydrated.   Continue tylenol, motrin for pain.   Use albuterol as needed for cough and wheezing.   Follow up with your doctor.   Return to ER if you have severe pain, fever, abdominal pain, vomiting.

## 2013-09-07 ENCOUNTER — Emergency Department (HOSPITAL_COMMUNITY)
Admission: EM | Admit: 2013-09-07 | Discharge: 2013-09-07 | Disposition: A | Discharge status: Home / Self Care | Payer: BC Managed Care – PPO | Attending: Emergency Medicine | Admitting: Emergency Medicine

## 2013-09-07 ENCOUNTER — Encounter (HOSPITAL_COMMUNITY): Payer: Self-pay | Admitting: Emergency Medicine

## 2013-09-07 DIAGNOSIS — M545 Low back pain, unspecified: Secondary | ICD-10-CM | POA: Insufficient documentation

## 2013-09-07 DIAGNOSIS — S39012A Strain of muscle, fascia and tendon of lower back, initial encounter: Secondary | ICD-10-CM

## 2013-09-07 DIAGNOSIS — R011 Cardiac murmur, unspecified: Secondary | ICD-10-CM | POA: Insufficient documentation

## 2013-09-07 DIAGNOSIS — X500XXA Overexertion from strenuous movement or load, initial encounter: Secondary | ICD-10-CM | POA: Diagnosis not present

## 2013-09-07 DIAGNOSIS — S335XXA Sprain of ligaments of lumbar spine, initial encounter: Secondary | ICD-10-CM | POA: Diagnosis not present

## 2013-09-07 DIAGNOSIS — Y93E2 Activity, laundry: Secondary | ICD-10-CM | POA: Diagnosis not present

## 2013-09-07 DIAGNOSIS — X503XXA Overexertion from repetitive movements, initial encounter: Secondary | ICD-10-CM | POA: Insufficient documentation

## 2013-09-07 DIAGNOSIS — Y9289 Other specified places as the place of occurrence of the external cause: Secondary | ICD-10-CM | POA: Insufficient documentation

## 2013-09-07 MED ORDER — METHOCARBAMOL 500 MG PO TABS: 500.0000 mg | ORAL_TABLET | Freq: Two times a day (BID) | ORAL | Status: DC

## 2013-09-07 MED ORDER — TRAMADOL HCL 50 MG PO TABS: 50.0000 mg | ORAL_TABLET | Freq: Four times a day (QID) | ORAL | Status: DC | PRN

## 2013-09-07 NOTE — ED Provider Notes (Signed)
Medical screening examination/treatment/procedure(s) were performed by non-physician practitioner and as supervising physician I was immediately available for consultation/collaboration.   EKG Interpretation None        Purvis SheffieldForrest Lenox Bink, MD 09/07/13 1707

## 2013-09-07 NOTE — ED Provider Notes (Signed)
CSN: [id number]     Arrival date & time 09/07/13  1141 History  This chart was scribed for Kathryn Helper, PA, working with Purvis Sheffield, MD found by Elon Spanner, ED Scribe. This patient was seen in room WTR7/WTR7 and the patient's care was started at 12:13 PM.   Chief Complaint  Patient presents with  . Back Pain   The history is provided by the patient. No language interpreter was used.    HPI Comments: Kathryn Miller is a 48 y.o. female who presents to the Emergency Department complaining of an acute exacerbation of intermittent lower back pain with radiation to her left shoulder blade onset last night.  Patient states she was folding clothes last night when the pain began.  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  She describes the pain as like something is "sitting on her back", "fiery", and sharp.  She states that the pain is aggravated by certain motions and breathing.  She states she typically takes Motrin every other night for her back pain, but that has provided no relief for this episode.  She also states she has had a currently resolved headache with vision changes described as blurry vision, both onset 2 days ago.  Patient reports she has had chicken pox.  Patient denies fever, chills, numbness, bowel/bladder incontinence, injury, nausea, vomiting, abd pain, dysuria, hematuria, or rash.  Past Medical History  Diagnosis Date  . Murmur, cardiac    Past Surgical History  Procedure Laterality Date  . Cesarean section  [phone]  . Tubal ligation  2004   History reviewed. No pertinent family history. History  Substance Use Topics  . Smoking status: Never Smoker   . Smokeless tobacco: Not on file  . Alcohol Use: No   OB History   Grav Para Term Preterm Abortions TAB SAB Ect Mult Living                 Review of Systems  Constitutional: Negative for fever and chills.  Gastrointestinal: Negative for nausea and vomiting.  Genitourinary:  Negative for dysuria.  Skin: Negative for rash.      Allergies  Review of patient's allergies indicates no known allergies.  Home Medications   Prior to Admission medications   Medication Sig Start Date End Date Taking? Authorizing Provider  albuterol (PROVENTIL HFA;VENTOLIN HFA) 108 (90 BASE) MCG/ACT inhaler Inhale 2 puffs into the lungs every 4 (four) hours as needed for wheezing. 12/07/11 07/18/13  Johnsie Kindred, NP  dextromethorphan 15 MG/5ML syrup Take 10 mLs (30 mg total) by mouth 4 (four) times daily as needed for cough. 07/18/13   Richardean Canal, MD  ibuprofen (ADVIL,MOTRIN) 200 MG tablet Take 800 mg by mouth every 6 (six) hours as needed for moderate pain.    Historical Provider, MD   BP 119/57  Pulse 68  Temp(Src) 97.7 F (36.5 C) (Oral)  Resp 20  SpO2 99%  LMP 08/18/2013 Physical Exam  Nursing note and vitals reviewed. Constitutional: She is oriented to person, place, and time. She appears well-developed and well-nourished. No distress.     HENT:  Head: Normocephalic and atraumatic.  Eyes: Conjunctivae and EOM are normal.  Neck: Neck supple. No tracheal deviation present.  Cardiovascular: Normal rate and intact distal pulses.   3/6 systolic heart murmer heard on 2nd intercostal space on right side of chest  Pulmonary/Chest: Effort normal. No respiratory distress.  Musculoskeletal: She exhibits tenderness.  Left paralumbar tenderness to  palpation. No crepitus, step-off, overlying skin changes.  No rash.   Limited rotational movement to back.   Neurological: She is alert and oriented to person, place, and time.  Patellar DTR's intact bilaterally.      Skin: Skin is warm and dry. No rash noted.  Psychiatric: She has a normal mood and affect. Her behavior is normal.    ED Course  Procedures (including critical care time)  DIAGNOSTIC STUDIES: Oxygen Saturation is 99% on RA, normal by my interpretation.    COORDINATION OF CARE:  12:19 PM Advised patient that  her complaint is indicative of a muscle strain.  Will prescribe pain medication prn.  Advised patient that unilateral pain can be pre-symptomatic of shingles, and to be alert for the formation of rash. Pt also has a heart murmur, this is not new.  Patient acknowledges and agrees with plan.    Labs Review Labs Reviewed - No data to display  Imaging Review No results found.   EKG Interpretation None      MDM   Final diagnoses:  Low back strain, initial encounter    BP 119/57  Pulse 67  Temp(Src) 97.7 F (36.5 C) (Oral)  Resp 20  SpO2 97%  LMP 08/18/2013   I personally performed the services described in this documentation, which was scribed in my presence. The recorded information has been reviewed and is accurate.     Kathryn HelperBowie Winn Muehl, PA-C 09/07/13 1244

## 2013-09-07 NOTE — Discharge Instructions (Signed)

## 2013-09-07 NOTE — ED Notes (Signed)
Per pt, was folding laundry and was raising arms above head.  Felt tug to left lower back/side.  Pt states pain in a spasm.  Comes and goes. Worse when taking a breath.

## 2014-10-17 ENCOUNTER — Emergency Department (HOSPITAL_COMMUNITY)
Admission: EM | Admit: 2014-10-17 | Discharge: 2014-10-17 | Disposition: A | Discharge status: Home / Self Care | Payer: Self-pay | Attending: Emergency Medicine | Admitting: Emergency Medicine

## 2014-10-17 ENCOUNTER — Encounter (HOSPITAL_COMMUNITY): Payer: Self-pay | Admitting: Neurology

## 2014-10-17 DIAGNOSIS — R011 Cardiac murmur, unspecified: Secondary | ICD-10-CM | POA: Insufficient documentation

## 2014-10-17 DIAGNOSIS — Z79899 Other long term (current) drug therapy: Secondary | ICD-10-CM | POA: Insufficient documentation

## 2014-10-17 DIAGNOSIS — M25511 Pain in right shoulder: Secondary | ICD-10-CM | POA: Insufficient documentation

## 2014-10-17 NOTE — ED Notes (Signed)
Pt reports right arm pain and shoulder pain for 2 weeks. Also pain in palm of her right hand. 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 Denies cp or sob. Pt is a x 4

## 2014-10-17 NOTE — ED Provider Notes (Signed)
CSN: [id number]     Arrival date & time 10/17/14  1046 History  By signing my name below, I, Emmanuella Mensah, attest that this documentation has been prepared under the direction and in the presence of [employer], PA-C. Electronically Signed: Angelene Giovanni, ED Scribe. 10/17/2014. 12:23 PM.      Chief Complaint  Patient presents with  . Shoulder Pain  . Arm Pain   The history is provided by the patient. No language interpreter was used.   HPI Comments: Kathryn Miller is a 49 y.o. female who presents to the Emergency Department complaining of a moderate gradually worsening burning right shoulder pain onset 2-3 weeks ago. She reports associated limited ROM of her right arm and pain in her right palm. She denies any fever, CP, or SOB. 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 She states that she took Ibuprofen with no relief. Patient denies fever, chills, nausea, vomiting, loss of distal sensation strength or function.  No PCP  Past Medical History  Diagnosis Date  . Murmur, cardiac    Past Surgical History  Procedure Laterality Date  . Cesarean section  [phone]  . Tubal ligation  2004   No family history on file. Social History  Substance Use Topics  . Smoking status: Never Smoker   . Smokeless tobacco: None  . Alcohol Use: No   OB History    No data available     Review of Systems  Constitutional: Negative for fever.  Respiratory: Negative for shortness of breath.   Cardiovascular: Negative for chest pain.  Musculoskeletal: Positive for arthralgias.  All other systems reviewed and are negative.   Allergies  Review of patient's allergies indicates no known allergies.  Home Medications   Prior to Admission medications   Medication Sig Start Date End Date Taking? Authorizing Emmalina Espericueta  albuterol (PROVENTIL HFA;VENTOLIN HFA) 108 (90 BASE) MCG/ACT inhaler Inhale 2 puffs into the lungs every  4 (four) hours as needed for wheezing. 12/07/11 07/18/13  Johnsie Kindred, NP  dextromethorphan 15 MG/5ML syrup Take 10 mLs (30 mg total) by mouth 4 (four) times daily as needed for cough. 07/18/13   Richardean Canal, MD  ibuprofen (ADVIL,MOTRIN) 200 MG tablet Take 800 mg by mouth every 6 (six) hours as needed for moderate pain.    Historical Danikah Budzik, MD  methocarbamol (ROBAXIN) 500 MG tablet Take 1 tablet (500 mg total) by mouth 2 (two) times daily. 09/07/13   Fayrene Helper, PA-C  traMADol (ULTRAM) 50 MG tablet Take 1 tablet (50 mg total) by mouth every 6 (six) hours as needed. 09/07/13   Fayrene Helper, PA-C   BP 120/78 mmHg  Pulse 74  Temp(Src) 98.2 F (36.8 C) (Oral)  Resp 14  SpO2 98%  LMP 10/10/2014 Physical Exam  Constitutional: She is oriented to person, place, and time. She appears well-developed and well-nourished. No distress.  HENT:  Head: Normocephalic and atraumatic.  Eyes: Conjunctivae and EOM are normal.  Neck: Neck supple. No tracheal deviation present.  Cardiovascular: Normal rate.   Pulmonary/Chest: Effort normal. No respiratory distress.  Musculoskeletal: Normal range of motion.  Shoulder symmetrical bilaterally No obvious swelling, deformity or swelling No TTP to shoulder or distal extremity Pain with active and passive flexion at 90 degrees, external rotation Distal sensation grossly intact Grip strength 5/5 Cap refills less than 3  2+ radial pulses.   Neurological: She is alert and oriented to  person, place, and time.  Skin: Skin is warm and dry.  Psychiatric: She has a normal mood and affect. Her behavior is normal.  Nursing note and vitals reviewed.   ED Course  Procedures (including critical care time) DIAGNOSTIC STUDIES: Oxygen Saturation is 98% on RA, normal by my interpretation.    COORDINATION OF CARE: 11:47 AM- Pt advised of plan for treatment and pt agrees.      EKG Interpretation None      MDM   Final diagnoses:  Right shoulder pain   Labs:  Imaging:  Consults:  Therapeutics:  Discharge Meds:   Assessment/Plan: No signs of trauma, infection, likely muscular in nature. Pt advised to use Ibuprofen or Tylenol with ice and to keep her arm mobile. Pt will be given exercises to try at home. Pt asked to establish care with a PCP and if pain persist, to refer to an Orthopedics. Patient verbalized understanding and agreement to today's plan, and strict return precautions given    I personally performed the services described in this documentation, which was scribed in my presence. The recorded information has been reviewed and is accurate.   Eyvonne Mechanic, PA-C 10/17/14 1511  Gilda Crease, MD 10/18/14 [phone]

## 2014-10-17 NOTE — Discharge Instructions (Signed)
Arthralgia Arthralgia is joint pain. A joint is a place where two bones meet. Joint pain can happen for many reasons. The joint can be bruised, stiff, infected, or weak from aging. Pain usually goes away after resting and taking medicine for soreness.  HOME CARE  Rest the joint as told by your doctor.  Keep the sore joint raised (elevated) for the first 24 hours.  Put ice on the joint area.  Put ice in a plastic bag.  Place a towel between your skin and the bag.  Leave the ice on for 15-20 minutes, 03-04 times a day.  Wear your splint, casting, elastic bandage, or sling as told by your doctor.  Only take medicine as told by your doctor. Do not take aspirin.  Use crutches as told by your doctor. Do not put weight on the joint until told to by your doctor. GET HELP RIGHT AWAY IF:   You have bruising, puffiness (swelling), or more pain.  Your fingers or toes turn blue or start to lose feeling (numb).  Your medicine does not lessen the pain.  Your pain becomes severe.  You have a temperature by mouth above 102 F (38.9 C), not controlled by medicine.  You cannot move or use the joint. MAKE SURE YOU:   Understand these instructions.  Will watch your condition.  Will get help right away if you are not doing well or get worse. Document Released: 12/23/2008 Document Revised: 03/29/2011 Document Reviewed: 12/23/2008 [facility] Patient Information 2015 [place], [place]. This information is not intended to replace advice given to you by your health care provider. Make sure you discuss any questions you have with your health care provider.   Please use ibuprofen or Tylenol as needed for pain. Please follow-up with primary care provider for reevaluation of symptoms continue to persist.

## 2014-10-18 ENCOUNTER — Ambulatory Visit: Payer: Self-pay

## 2014-10-24 ENCOUNTER — Ambulatory Visit: Payer: Self-pay

## 2014-10-31 ENCOUNTER — Ambulatory Visit: Payer: Self-pay

## 2015-03-31 ENCOUNTER — Encounter (HOSPITAL_COMMUNITY): Payer: Self-pay | Admitting: Vascular Surgery

## 2015-03-31 ENCOUNTER — Emergency Department (HOSPITAL_COMMUNITY)
Admission: EM | Admit: 2015-03-31 | Discharge: 2015-03-31 | Disposition: A | Discharge status: Home / Self Care | Payer: BLUE CROSS/BLUE SHIELD | Attending: Emergency Medicine | Admitting: Emergency Medicine

## 2015-03-31 ENCOUNTER — Emergency Department (HOSPITAL_COMMUNITY): Payer: BLUE CROSS/BLUE SHIELD

## 2015-03-31 DIAGNOSIS — Z79899 Other long term (current) drug therapy: Secondary | ICD-10-CM | POA: Insufficient documentation

## 2015-03-31 DIAGNOSIS — R011 Cardiac murmur, unspecified: Secondary | ICD-10-CM | POA: Diagnosis not present

## 2015-03-31 DIAGNOSIS — M25512 Pain in left shoulder: Secondary | ICD-10-CM

## 2015-03-31 NOTE — ED Notes (Signed)
Pt back from x-ray.

## 2015-03-31 NOTE — ED Provider Notes (Signed)
CSN: [id number]     Arrival date & time 03/31/15  1909 History  By signing my name below, I, Linna Darner, attest that this documentation has been prepared under the direction and in the presence of non-physician practitioner, Audry Pili, PA-C. Electronically Signed: Linna Darner, Scribe. 03/31/2015. 7:28 PM.    Chief Complaint  Patient presents with  . Arm Pain    The history is provided by the patient. No language interpreter was used.     HPI Comments: NYCOLE KAWAHARA is a 50 y.o. female with no pertinent PMHx who presents to the Emergency Department complaining of constant, burning, throbbing, 10/10 left shoulder pain onset 5 days. She states that her left arm is tight from her left elbow into her left shoulder and feels like it is going to "bust open." She notes that her left arm pain occasionally radiates into the left side of her neck which prevents her from moving her head. Pt endorses pain exacerbation with left arm movement and palpation of her upper left arm and left shoulder. She states that she experiences significant upper left arm pain upon relaxation of her left arm and "tenses up" her left arm to reduce the pain. Pt is able to wiggle her left fingers and notes that she does not have pain in her left forearm or left hand. She endorses numbness/tingling in her left fingers. She denies any recent trauma or heavy lifting. Pt works as a Lawyer. She has h/o right shoulder issues but not left shoulder issues. Pt has been taking motrin daily with no relief. She denies sensation loss or any other associated symptoms.  Past Medical History  Diagnosis Date  . Murmur, cardiac    Past Surgical History  Procedure Laterality Date  . Cesarean section  [phone]  . Tubal ligation  2004   No family history on file. Social History  Substance Use Topics  . Smoking status: Never Smoker   . Smokeless tobacco: None  . Alcohol Use: No   OB History    No data available     Review  of Systems A complete 10 system review of systems was obtained and all systems are negative except as noted in the HPI and PMH.    Allergies  Review of patient's allergies indicates no known allergies.  Home Medications   Prior to Admission medications   Medication Sig Start Date End Date Taking? Authorizing Provider  albuterol (PROVENTIL HFA;VENTOLIN HFA) 108 (90 BASE) MCG/ACT inhaler Inhale 2 puffs into the lungs every 4 (four) hours as needed for wheezing. 12/07/11 07/18/13  Johnsie Kindred, NP  dextromethorphan 15 MG/5ML syrup Take 10 mLs (30 mg total) by mouth 4 (four) times daily as needed for cough. 07/18/13   Richardean Canal, MD  ibuprofen (ADVIL,MOTRIN) 200 MG tablet Take 800 mg by mouth every 6 (six) hours as needed for moderate pain.    Historical Provider, MD  methocarbamol (ROBAXIN) 500 MG tablet Take 1 tablet (500 mg total) by mouth 2 (two) times daily. 09/07/13   Fayrene Helper, PA-C  traMADol (ULTRAM) 50 MG tablet Take 1 tablet (50 mg total) by mouth every 6 (six) hours as needed. 09/07/13   Fayrene Helper, PA-C   BP 137/93 mmHg  Pulse 84  Temp(Src) 99.2 F (37.3 C) (Oral)  Resp 22  Ht  (1.549 m)  Wt 223 lb 6 oz (101.322 kg)  BMI 42.23 kg/m2  SpO2 100%   Physical Exam  Constitutional: She is oriented to person,  place, and time. She appears well-developed and well-nourished. No distress.  HENT:  Head: Normocephalic and atraumatic.  Eyes: Conjunctivae and EOM are normal. Pupils are equal, round, and reactive to light.  Neck: Normal range of motion. Neck supple. No tracheal deviation present.  Cardiovascular: Normal rate.   Pulmonary/Chest: Effort normal. No respiratory distress.  Musculoskeletal:       Left shoulder: She exhibits decreased range of motion, tenderness and pain. She exhibits no swelling, no deformity, normal pulse and normal strength.       Left elbow: Normal. She exhibits normal range of motion, no swelling, no effusion, no deformity and no laceration. No  tenderness found.  Left shoulder TTP. Limited ROM with abduction. Internal rotation/external rotation intact.   Neurological: She is alert and oriented to person, place, and time.  Skin: Skin is warm and dry.  Psychiatric: She has a normal mood and affect. Her behavior is normal.  Nursing note and vitals reviewed.   ED Course  Procedures (including critical care time)  DIAGNOSTIC STUDIES: Oxygen Saturation is 100% on RA, normal by my interpretation.    COORDINATION OF CARE: 7:28 PM Will order DG Shoulder Left. Discussed treatment plan with pt at bedside and pt agreed to plan.   Labs Review Labs Reviewed - No data to display  Imaging Review Dg Shoulder Left  03/31/2015  CLINICAL DATA:  Left shoulder pain, tingling in fingers. EXAM: LEFT SHOULDER - 2+ VIEW COMPARISON:  None. FINDINGS: There is no evidence of fracture or dislocation. There is no evidence of arthropathy or other focal bone abnormality. Soft tissues are unremarkable. IMPRESSION: Negative. Electronically Signed   By: Bary RichardStan  Maynard M.D.   On: 03/31/2015 20:36   I have personally reviewed and evaluated these images and lab results as part of my medical decision-making.   EKG Interpretation None      MDM  I have reviewed and evaluated the relevant imaging studies. I obtained HPI from historian.  ED Course:  Assessment: Pt is a 49yF who presents with left shoulder pain x1 month. On exam, pt in NAD. Nontoxic/nonseptic appearing. VSS. Afebril. L shoulder TTP. Decrease ROM with abduction. Internal/external rotation intact. Imaging shows no acute abnormalities. Possible impingement vs rotator cuff. Plan is to DC with follow up to Orthopedics for further management. At time of discharge, Patient is in no acute distress. Vital Signs are stable. Patient is able to ambulate. Patient able to tolerate PO.    Disposition/Plan:  DC Home Additional Verbal discharge instructions given and discussed with patient.  Pt Instructed to  f/u with Orthopedics Return precautions given Pt acknowledges and agrees with plan  Supervising Physician Rolland PorterMark James, MD   Final diagnoses:  Left shoulder pain   I personally performed the services described in this documentation, which was scribed in my presence. The recorded information has been reviewed and is accurate.    Audry Piliyler Joie Reamer, PA-C 03/31/15 [phone]  Rolland PorterMark James, MD 04/12/15 [phone]

## 2015-03-31 NOTE — Discharge Instructions (Signed)
Please read and follow all provided instructions.  Your diagnoses today include:  1. Left shoulder pain    Tests performed today include:  Vital signs. See below for your results today.   Medications prescribed:   None  Home care instructions:  Follow any educational materials contained in this packet.  Follow-up instructions: Please follow-up with your primary care provider in the next 48 hours for further evaluation of symptoms and treatment   Return instructions:   Please return to the Emergency Department if you do not get better, if you get worse, or new symptoms OR  - Fever (temperature greater than 101.57F)  - Bleeding that does not stop with holding pressure to the area    -Severe pain (please note that you may be more sore the day after your accident)  - Chest Pain  - Difficulty breathing  - Severe nausea or vomiting  - Inability to tolerate food and liquids  - Passing out  - Skin becoming red around your wounds  - Change in mental status (confusion or lethargy)  - New numbness or weakness     Please return if you have any other emergent concerns.  Additional Information:  Your vital signs today were: BP 137/93 mmHg   Pulse 84   Temp(Src) 99.2 F (37.3 C) (Oral)   Resp 22   Ht 5\' 1"  (1.549 m)   Wt 101.322 kg   BMI 42.23 kg/m2   SpO2 100% If your blood pressure (BP) was elevated above 135/85 this visit, please have this repeated by your doctor within one month. ---------------

## 2015-03-31 NOTE — ED Notes (Addendum)
Pt reports to the ED for eval of right shoulder pain x 1 month. She reports she feels like she pulled something. Then she reports this week her elbow area is tight like a band. Denies any pain distal to the elbow pain but she does have some pain in her left shoulder and bicep area as well. 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 Pt A&Ox4, resp e/u, and skin warm and dry.

## 2015-04-03 ENCOUNTER — Encounter (HOSPITAL_COMMUNITY): Payer: Self-pay | Admitting: *Deleted

## 2015-04-03 ENCOUNTER — Emergency Department (INDEPENDENT_AMBULATORY_CARE_PROVIDER_SITE_OTHER)
Admission: EM | Admit: 2015-04-03 | Discharge: 2015-04-03 | Disposition: A | Discharge status: Home / Self Care | Payer: Worker's Compensation | Source: Home / Self Care | Attending: Family Medicine | Admitting: Family Medicine

## 2015-04-03 DIAGNOSIS — M7552 Bursitis of left shoulder: Secondary | ICD-10-CM | POA: Diagnosis not present

## 2015-04-03 MED ORDER — MELOXICAM 7.5 MG PO TABS: 7.5000 mg | ORAL_TABLET | Freq: Two times a day (BID) | ORAL | Status: DC

## 2015-04-03 NOTE — ED Notes (Signed)
Pt  Reports  Pain l  Arm      Radiating   To    l  Shoulder       Pt  Reports         She  Was    Seen  Er  3 days  Ago   And  Had  X  Rays  Done     She  Reports  Was told  By  Her  Job  To  Come  Here

## 2015-04-03 NOTE — ED Provider Notes (Addendum)
CSN: [id number]     Arrival date & time 04/03/15  1610 History   First MD Initiated Contact with Patient 04/03/15 1718     Chief Complaint  Patient presents with  . Follow-up   (Consider location/radiation/quality/duration/timing/severity/associated sxs/prior Treatment) Patient is a 50 y.o. female presenting with shoulder pain. The history is provided by the patient.  Shoulder Pain Location:  Shoulder Injury: no   Shoulder location:  L shoulder Pain details:    Quality:  Sharp   Radiates to:  L upper arm   Severity:  Moderate   Onset quality:  Gradual   Progression:  Unchanged Chronicity:  New (seen in ER for same on 3/13 but not resolved, has ortho appt next week.) Dislocation: no   Prior injury to area:  No   Past Medical History  Diagnosis Date  . Murmur, cardiac    Past Surgical History  Procedure Laterality Date  . Cesarean section  [id number]  . Tubal ligation  2004   History reviewed. No pertinent family history. Social History  Substance Use Topics  . Smoking status: Never Smoker   . Smokeless tobacco: None  . Alcohol Use: No   OB History    No data available     Review of Systems  Constitutional: Negative.   Musculoskeletal: Negative for joint swelling.  Skin: Negative.   All other systems reviewed and are negative.   Allergies  Review of patient's allergies indicates no known allergies.  Home Medications   Prior to Admission medications   Medication Sig Start Date End Date Taking? Authorizing Provider  albuterol (PROVENTIL HFA;VENTOLIN HFA) 108 (90 BASE) MCG/ACT inhaler Inhale 2 puffs into the lungs every 4 (four) hours as needed for wheezing. 12/07/11 07/18/13  Johnsie Kindred, NP  dextromethorphan 15 MG/5ML syrup Take 10 mLs (30 mg total) by mouth 4 (four) times daily as needed for cough. 07/18/13   Richardean Canal, MD  ibuprofen (ADVIL,MOTRIN) 200 MG tablet Take 800 mg by mouth every 6 (six) hours as needed for moderate pain.    Historical  Provider, MD  meloxicam (MOBIC) 7.5 MG tablet Take 1 tablet (7.5 mg total) by mouth 2 (two) times daily after a meal. 04/03/15   Linna Hoff, MD  methocarbamol (ROBAXIN) 500 MG tablet Take 1 tablet (500 mg total) by mouth 2 (two) times daily. 09/07/13   Fayrene Helper, PA-C  traMADol (ULTRAM) 50 MG tablet Take 1 tablet (50 mg total) by mouth every 6 (six) hours as needed. 09/07/13   Fayrene Helper, PA-C   Meds Ordered and Administered this Visit  Medications - No data to display  BP 120/78 mmHg  Pulse 78  Temp(Src) 98.6 F (37 C) (Oral)  Resp 16  SpO2 100% No data found.   Physical Exam  Constitutional: She is oriented to person, place, and time. She appears well-developed and well-nourished.  Musculoskeletal: She exhibits tenderness.       Left shoulder: She exhibits decreased range of motion, tenderness, bony tenderness, swelling, pain and spasm. She exhibits no effusion, no crepitus, no deformity and normal pulse.  Neurological: She is alert and oriented to person, place, and time.  Skin: Skin is warm and dry.  Nursing note and vitals reviewed.   ED Course  Procedures (including critical care time)  Labs Review Labs Reviewed - No data to display  Imaging Review No results found.   Visual Acuity Review  Right Eye Distance:   Left Eye Distance:   Bilateral Distance:  Right Eye Near:   Left Eye Near:    Bilateral Near:         MDM   1. Chronic shoulder bursitis, left    Meds ordered this encounter  Medications  . meloxicam (MOBIC) 7.5 MG tablet    Sig: Take 1 tablet (7.5 mg total) by mouth 2 (two) times daily after a meal.    Dispense:  30 tablet    Refill:  1        Linna HoffJames D Brilynn Biasi, MD 04/03/15 1744  Linna HoffJames D Nubia Ziesmer, MD 04/03/15 1747  Linna HoffJames D Edras Wilford, MD 04/08/15 [phone]

## 2015-05-22 ENCOUNTER — Other Ambulatory Visit: Payer: Self-pay | Admitting: Sports Medicine

## 2015-05-22 DIAGNOSIS — M542 Cervicalgia: Secondary | ICD-10-CM

## 2015-06-09 ENCOUNTER — Ambulatory Visit
Admission: RE | Admit: 2015-06-09 | Discharge: 2015-06-09 | Disposition: A | Discharge status: Home / Self Care | Payer: BLUE CROSS/BLUE SHIELD | Source: Ambulatory Visit | Attending: Sports Medicine | Admitting: Sports Medicine

## 2015-06-09 DIAGNOSIS — M542 Cervicalgia: Secondary | ICD-10-CM

## 2015-11-13 ENCOUNTER — Ambulatory Visit (INDEPENDENT_AMBULATORY_CARE_PROVIDER_SITE_OTHER): Payer: Worker's Compensation | Admitting: Sports Medicine

## 2015-11-13 ENCOUNTER — Encounter (INDEPENDENT_AMBULATORY_CARE_PROVIDER_SITE_OTHER): Payer: Self-pay | Admitting: Sports Medicine

## 2015-11-13 VITALS — BP 125/80 | HR 76 | Ht 61.0 in

## 2015-11-13 DIAGNOSIS — M25512 Pain in left shoulder: Secondary | ICD-10-CM | POA: Diagnosis not present

## 2015-11-13 MED ORDER — PREGABALIN 75 MG PO CAPS: 75.0000 mg | ORAL_CAPSULE | Freq: Two times a day (BID) | ORAL | 2 refills | Status: DC

## 2015-11-13 NOTE — Progress Notes (Signed)
Kathryn Miller - 50 y.o. female MRN [id number]  Date of birth: 09/12/1965  Office Visit Note: Visit Date: 11/13/2015 PCP: No PCP Per Patient Referred by: No ref. provider found  Subjective: Chief Complaint  Patient presents with  . Neck - Follow-up  . Shoulder Pain   HPI: Patient states shoulder is good, but neck still has some stiffness. Numbness and tingling, burning and itching in Left arm and hand.  Muscle tightens on the back of her left arm and elbow.  States Injection helped a little.  States she is currently in physical therapy.  She continues to have pain that radiates into her upper arm. Down into the hand. Pain doesn't keep her awake at night on a regular basis. Gabapentin is only tolerated to 100mg  throughout the day & 300mg  at night due to excessive somnolence.  Persistent pain & weakness in the entire left upper extremity.     ROS Otherwise per HPI.  Assessment & Plan: Visit Diagnoses:  1. Acute pain of left shoulder     Plan:   Patient has continued to progress. Overall she has been able to return to work but does feel this is quite taxing for her. She is happy with where she is currently but not back to 100%. I would like for her to continue with rehabilitation for the left shoulder & neck & will plan to follow-up with her as below.  Work note provided.                Meds & Orders:  Meds ordered this encounter  Medications  . pregabalin (LYRICA) 75 MG capsule    Sig: Take 1 capsule (75 mg total) by mouth 2 (two) times daily.    Dispense:  60 capsule    Refill:  2   No orders of the defined types were placed in this encounter.   Follow-up: Return in about 6 weeks (around 12/25/2015).   Procedures: No procedures performed  No notes on file   Clinical History: No additional findings.  She reports that she has never smoked. She does not have any smokeless tobacco history on file. No results for input(s): HGBA1C, LABURIC in the last 8760 hours.  Objective:   VS:  HT:5\' 1"  (154.9 cm)   WT:   BMI:     BP:125/80  HR:76bpm  TEMP: ( )  RESP:  Physical Exam  Constitutional: No distress.  HENT:  Head: Normocephalic and atraumatic.  Eyes: Right eye exhibits no discharge. Left eye exhibits no discharge. No scleral icterus.  Pulmonary/Chest: Effort normal. No respiratory distress.  Musculoskeletal:  Cervical limitations in range of motion with left periscapular tenderness to palpation. sting & Yergason testing are all normal. Marked pain with arm squeeze test. Dysthesia in the C5/C6 distribution.  Neurological: She is alert.  Appropriately interactive.  Skin: Skin is warm and dry. No rash noted. She is not diaphoretic. No erythema. No pallor.  Psychiatric: Judgment normal.    Ortho Exam Imaging: No results found.  Past Medical/Family/Surgical/Social History: Patient Active Problem List   Diagnosis Date Noted  . HYPERTHYROIDISM 07/16/2009  . CHALAZION 07/15/2009  . SYSTOLIC MURMUR 07/15/2009  . METRORRHAGIA 10/07/2008  . BACTERIAL VAGINITIS 02/28/2008  . HOT FLASHES 02/28/2008  . Pain in limb 08/30/2007  . DENTAL CARIES 08/08/2007  . ALLERGIC RHINITIS 06/02/2007  . OTITIS MEDIA, LEFT 03/09/2007  . CARBUNCLE, ARM 03/09/2007  . PLANTAR FASCIITIS 05/21/2006  . ANEMIA-IRON DEFICIENCY 11/16/2005   Past Medical History:  Diagnosis  Date  . Murmur, cardiac    No family history on file. Past Surgical History:  Procedure Laterality Date  . CESAREAN SECTION  [phone]  . TUBAL LIGATION  2004   Social History   Occupational History  . Not on file.   Social History Main Topics  . Smoking status: Never Smoker  . Smokeless tobacco: Not on file  . Alcohol use No  . Drug use: No  . Sexual activity: Not on file

## 2015-11-19 ENCOUNTER — Telehealth (INDEPENDENT_AMBULATORY_CARE_PROVIDER_SITE_OTHER): Payer: Self-pay | Admitting: Sports Medicine

## 2015-11-19 NOTE — Telephone Encounter (Signed)
See message below concerning Rx change due to cost of Rx prescribed. Thank You

## 2015-11-19 NOTE — Telephone Encounter (Signed)
Kathryn Miller (PT) with ([facility]) called advised  7th visit was today with 5 visits left. She advised patient went to get Rx filled (lyrica) and it was $500.00. She advised patient is still in a lot of pain and limited on what she can do.   The number to contact Kathryn is [phone]

## 2015-11-19 NOTE — Telephone Encounter (Signed)
Patient called advised the Lyrica is $500.00 and she can not get the Rx. Patient asked if she can get another Rx for pain. Patient said her pain level is above a 10. Patient said if she can not get another Rx she will need to be out of work until she can get control of the pain.   The contact number is [phone]   Patient said  To leave message

## 2015-11-20 NOTE — Telephone Encounter (Signed)
I'm very confused. Obviously Lyrica would not be an option for her that I'm not sure why she is hurting so bad. She reported immediate other day that she is doing significantly better. I tried calling with tenderness but she was out of the office for the rest of the day. Will try to touch base with her again tomorrow. The patient should continue on gabapentin at this time if she is unable to get the Lyrica filled. Please call & let the patient no to continue the gabapentin into the discontinue the Lyrica prescription. Also please clarify how much she is hurting because once again during the office visit the other day she reported to me she was significantly better & not 100% but feeling pretty good.

## 2015-11-20 NOTE — Telephone Encounter (Signed)
Talked with patient and advised her of message per Dr. Berline Choughigby.  Patient stated that she is in so much pain because she had a bad cold and was coughing a lot and the coughing caused her to have a flare-up.  States pain level is above 10 and having some burning.

## 2015-11-21 ENCOUNTER — Encounter (INDEPENDENT_AMBULATORY_CARE_PROVIDER_SITE_OTHER): Payer: Self-pay

## 2015-11-21 NOTE — Telephone Encounter (Signed)
Okay to provide note for being out of work until Wednesday, 11/26/2015. However continue with the gabapentin. If persistent ongoing symptoms after that we will need to reevaluate her sister significantly different than she was seen earlier this week.

## 2015-11-25 ENCOUNTER — Encounter (INDEPENDENT_AMBULATORY_CARE_PROVIDER_SITE_OTHER): Payer: Self-pay

## 2015-11-25 NOTE — Telephone Encounter (Signed)
Talked with patient and advised her of message per Dr. Berline Choughigby concerning work note, Rx and possible reevaluation.  Faxed work  Note to [employer] at [phone].

## 2015-11-25 NOTE — Progress Notes (Signed)
   Kathryn QuanWanda F Miller - 50 y.o. female MRN [id number]  Date of birth: September 22, 1965  Office Visit Note: Visit Date: 11/25/2015 PCP: No PCP Per Patient Referred by: No ref. provider found  Subjective: No chief complaint on file.  HPI: HPI  ROS Otherwise per HPI.  Assessment & Plan: Visit Diagnoses: No diagnosis found.  Plan: No additional findings.   Meds & Orders: No orders of the defined types were placed in this encounter.  No orders of the defined types were placed in this encounter.   Follow-up: No Follow-up on file.   Procedures: No procedures performed  No notes on file   Clinical History: MRI cervical spine: 06/09/2015: C5/C6 mild dick with uncovertebral changes with mild right/moderate left foraminal stenosis.  S/p C7/T1 IL ESI on 09/29/2015 with Dr. Alvester MorinNewton.  She reports that she has never smoked. She does not have any smokeless tobacco history on file. No results for input(s): HGBA1C, LABURIC in the last 8760 hours.  Objective:  VS:  HT:    WT:   BMI:     BP:   HR: bpm  TEMP: ( )  RESP:  Physical Exam  Ortho Exam Imaging: No results found.  Past Medical/Family/Surgical/Social History: Medications & Allergies reviewed per EMR Patient Active Problem List   Diagnosis Date Noted  . HYPERTHYROIDISM 07/16/2009  . CHALAZION 07/15/2009  . SYSTOLIC MURMUR 07/15/2009  . METRORRHAGIA 10/07/2008  . BACTERIAL VAGINITIS 02/28/2008  . HOT FLASHES 02/28/2008  . Pain in limb 08/30/2007  . DENTAL CARIES 08/08/2007  . ALLERGIC RHINITIS 06/02/2007  . OTITIS MEDIA, LEFT 03/09/2007  . CARBUNCLE, ARM 03/09/2007  . PLANTAR FASCIITIS 05/21/2006  . ANEMIA-IRON DEFICIENCY 11/16/2005   Past Medical History:  Diagnosis Date  . Murmur, cardiac    No family history on file. Past Surgical History:  Procedure Laterality Date  . CESAREAN SECTION  [phone],1996,1988,1986  . TUBAL LIGATION  2004   Social History   Occupational History  . Not on file.   Social History Main Topics    . Smoking status: Never Smoker  . Smokeless tobacco: Not on file  . Alcohol use No  . Drug use: No  . Sexual activity: Not on file

## 2015-11-26 ENCOUNTER — Telehealth (INDEPENDENT_AMBULATORY_CARE_PROVIDER_SITE_OTHER): Payer: Self-pay

## 2015-11-26 NOTE — Telephone Encounter (Signed)
Patient called and left voicemail stating that her job didn't receive work note in time to take her off the schedule, so she would like to know if she can receive another work note starting from 11/28/15-12/02/15 for her arm.

## 2015-11-26 NOTE — Telephone Encounter (Signed)
Talked with patient and advise her that Dr. Berline Choughigby is currently out of the ofc for today and I won't know about work note until tomorrow.

## 2015-11-27 ENCOUNTER — Encounter (INDEPENDENT_AMBULATORY_CARE_PROVIDER_SITE_OTHER): Payer: Self-pay

## 2015-11-27 ENCOUNTER — Telehealth (INDEPENDENT_AMBULATORY_CARE_PROVIDER_SITE_OTHER): Payer: Self-pay

## 2015-11-27 ENCOUNTER — Telehealth (INDEPENDENT_AMBULATORY_CARE_PROVIDER_SITE_OTHER): Payer: Self-pay | Admitting: Sports Medicine

## 2015-11-27 NOTE — Telephone Encounter (Signed)
Okay to provide note as requested. °

## 2015-11-27 NOTE — Telephone Encounter (Signed)
Patient is in a lot of pain and wants to hold off on therapy.   Contact Info: [facility] [phone]

## 2015-11-27 NOTE — Progress Notes (Signed)
   Kathryn Miller - 50 y.o. female MRN [id number]  Date of birth: 06/22/1965  Office Visit Note: Visit Date: 11/27/2015 PCP: No PCP Per Patient Referred by: No ref. provider found  Subjective: No chief complaint on file.  HPI: HPI  ROS Otherwise per HPI.  Assessment & Plan: Visit Diagnoses: No diagnosis found.  Plan: No additional findings.   Meds & Orders: No orders of the defined types were placed in this encounter.  No orders of the defined types were placed in this encounter.   Follow-up: No Follow-up on file.   Procedures: No procedures performed  No notes on file   Clinical History: MRI cervical spine: 06/09/2015: C5/C6 mild dick with uncovertebral changes with mild right/moderate left foraminal stenosis.  S/p C7/T1 IL ESI on 09/29/2015 with Dr. Alvester MorinNewton.  She reports that she has never smoked. She does not have any smokeless tobacco history on file. No results for input(s): HGBA1C, LABURIC in the last 8760 hours.  Objective:  VS:  HT:    WT:   BMI:     BP:   HR: bpm  TEMP: ( )  RESP:  Physical Exam  Ortho Exam Imaging: No results found.  Past Medical/Family/Surgical/Social History: Medications & Allergies reviewed per EMR Patient Active Problem List   Diagnosis Date Noted  . HYPERTHYROIDISM 07/16/2009  . CHALAZION 07/15/2009  . SYSTOLIC MURMUR 07/15/2009  . METRORRHAGIA 10/07/2008  . BACTERIAL VAGINITIS 02/28/2008  . HOT FLASHES 02/28/2008  . Pain in limb 08/30/2007  . DENTAL CARIES 08/08/2007  . ALLERGIC RHINITIS 06/02/2007  . OTITIS MEDIA, LEFT 03/09/2007  . CARBUNCLE, ARM 03/09/2007  . PLANTAR FASCIITIS 05/21/2006  . ANEMIA-IRON DEFICIENCY 11/16/2005   Past Medical History:  Diagnosis Date  . Murmur, cardiac    No family history on file. Past Surgical History:  Procedure Laterality Date  . CESAREAN SECTION  [phone],1996,1988,1986  . TUBAL LIGATION  2004   Social History   Occupational History  . Not on file.   Social History Main Topics    . Smoking status: Never Smoker  . Smokeless tobacco: Not on file  . Alcohol use No  . Drug use: No  . Sexual activity: Not on file

## 2015-11-27 NOTE — Telephone Encounter (Signed)
Please see message concerning physical therapy for patient.

## 2015-11-27 NOTE — Telephone Encounter (Signed)
Talked with patient and advised her that work note was approved by Dr. Berline Choughigby and work note was faxed to [employer] at [phone].

## 2015-11-28 NOTE — Telephone Encounter (Signed)
See other notes

## 2015-12-09 ENCOUNTER — Telehealth (INDEPENDENT_AMBULATORY_CARE_PROVIDER_SITE_OTHER): Payer: Self-pay | Admitting: Sports Medicine

## 2015-12-09 NOTE — Telephone Encounter (Signed)
Letter  Lawyer drafted

## 2015-12-25 ENCOUNTER — Ambulatory Visit (INDEPENDENT_AMBULATORY_CARE_PROVIDER_SITE_OTHER): Payer: Self-pay | Admitting: Sports Medicine

## 2015-12-29 ENCOUNTER — Ambulatory Visit (INDEPENDENT_AMBULATORY_CARE_PROVIDER_SITE_OTHER): Payer: Self-pay | Admitting: Sports Medicine

## 2016-01-01 ENCOUNTER — Ambulatory Visit (INDEPENDENT_AMBULATORY_CARE_PROVIDER_SITE_OTHER): Payer: Worker's Compensation | Admitting: Sports Medicine

## 2016-01-01 ENCOUNTER — Encounter (INDEPENDENT_AMBULATORY_CARE_PROVIDER_SITE_OTHER): Payer: Self-pay | Admitting: Sports Medicine

## 2016-01-01 ENCOUNTER — Encounter (INDEPENDENT_AMBULATORY_CARE_PROVIDER_SITE_OTHER): Payer: Self-pay

## 2016-01-01 VITALS — BP 125/77 | HR 63 | Ht 61.0 in | Wt 200.0 lb

## 2016-01-01 DIAGNOSIS — M79602 Pain in left arm: Secondary | ICD-10-CM | POA: Diagnosis not present

## 2016-01-01 NOTE — Patient Instructions (Signed)
We are ordering an MRI for you today.  The imaging office will be calling you to schedule your appointment after we obtain authorization from your insurance company.  Immediately after you schedule your appointment with them please call our office back ([phone]) to schedule a follow-up appointment with me.  This will need to be at least 24 hours after you have the test done to give radiologist and myself time to review the test.  We will discuss further treatment options at your follow up appointment.   

## 2016-01-01 NOTE — Progress Notes (Addendum)
Kathryn Miller - 50 y.o. female MRN [id number]  Date of birth: [birth year]  Office Visit Note: Visit Date: 01/01/2016 PCP: No PCP Per Patient Referred by: No ref. provider found  Subjective: Chief Complaint  Patient presents with  . Neck - Follow-up  . Left Shoulder - Follow-up  . Follow-up    Patient states left shoulder is tight and having burning from back of her neck radiating down into left arm.  Numbness and burning in left hand and left thumb.   HPI: Patient reports that since her last office visit she has had a significant flareup of her left shoulder & arm & neck pain. She had been doing well the last time we saw her however quickly developed worsening symptoms when the weather changed. She reports pain that is mainly located in the posterior aspect of the shoulder but does radiate down into the C6 distribution. Pain is worse with any type of shoulder motion. Worsened by lifting & reaching overhead. No significant clicking or popping. Pain is keeping her awake at night intermittently. She has not had any significant benefit while taking gabapentin or anti-inflammatories. She has completed physical therapy & continues to do some therapeutic exercises at home without significant improvement. ROS: Otherwise per HPI.   Clinical History: MRI cervical spine: 06/09/2015: C5/C6 mild dick with uncovertebral changes with mild right/moderate left foraminal stenosis.  S/p C7/T1 IL ESI on 09/29/2015 with Dr. Alvester MorinNewton.  She reports that she has never smoked. She does not have any smokeless tobacco history on file.  No results for input(s): HGBA1C, LABURIC in the last 8760 hours.  Assessment & Plan: Visit Diagnoses:    ICD-9-CM ICD-10-CM   1. Left arm pain 729.5 M79.602 MR Shoulder Left w/ contrast     DG FLUORO GUIDED NEEDLE PLC ASPIRATION/INJECTION LOC    Plan: Patient does have persistent ongoing left shoulder pain that has not responded as expected. She does have more isolated left shoulder  pain with concerns for potential labral tear versus adverse been on a stair. MR arthrogram of the shoulder is indicated at this time for further characterization of the ongoing arm pain that she is experiencing since the injury at work on 03/25/2015. Given the lack of improvement while addressing the radicular nature of her pain suggests possible intrinsic shoulder pathoetiology that is causing shoulder-hand syndrome. If the MRI does indicate any of the above findings I would directly relate this to the incident that occurred while at work on 03/25/2015.  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 Recommend she follow up with Dr. August Saucerean to review the findings of the MRI if this is done after I leave. Otherwise I am happy to see her back myself if done prior to 01/10/2016.  Follow-up: Return for MRI review.  Meds: No orders of the defined types were placed in this encounter.  Procedures: No notes on file   Objective:  VS:  HT:5\' 1"  (154.9 cm)   WT:200 lb (90.7 kg)  BMI:37.9    BP:125/77  HR:63bpm  TEMP: ( )  RESP:  Physical Exam:  Adult female. Alert and appropriate.  In no acute distress.  Upper extremities are overall well aligned with no significant deformity. No significant swelling.  Distal pulses 2+/4. No significant bruising/ecchymosis or erythema the skin Left arm & neck: Some pain with brachial plexus squeeze  as well as arm squeeze test. Positive Hawkins as well as Neer's which is most significant. Mild pain with speeds testing moderate to severe pain with Yergason's testing. No focal crepitation with axial loading & circumduction but marked pain with this test. She is focally weak with external rotation today which is more specific than in the past. Otherwise her upper extremity strength is intact to upper extremity myotomes. Sensation is intact to light touch  although she does report a generalized dysthesia in the C6 distribution. Imaging: No results found.  Past Medical/Family/Surgical/Social History: Medications & Allergies reviewed per EMR Patient Active Problem List   Diagnosis Date Noted  . HYPERTHYROIDISM 07/16/2009  . CHALAZION 07/15/2009  . SYSTOLIC MURMUR 07/15/2009  . METRORRHAGIA 10/07/2008  . BACTERIAL VAGINITIS 02/28/2008  . HOT FLASHES 02/28/2008  . Pain in limb 08/30/2007  . DENTAL CARIES 08/08/2007  . ALLERGIC RHINITIS 06/02/2007  . OTITIS MEDIA, LEFT 03/09/2007  . CARBUNCLE, ARM 03/09/2007  . PLANTAR FASCIITIS 05/21/2006  . ANEMIA-IRON DEFICIENCY 11/16/2005   Past Medical History:  Diagnosis Date  . Murmur, cardiac    No family history on file. Past Surgical History:  Procedure Laterality Date  . CESAREAN SECTION  [phone],1996,1988,1986  . TUBAL LIGATION  2004   Social History   Occupational History  . Not on file.   Social History Main Topics  . Smoking status: Never Smoker  . Smokeless tobacco: Not on file  . Alcohol use No  . Drug use: No  . Sexual activity: Not on file

## 2016-01-02 ENCOUNTER — Telehealth (INDEPENDENT_AMBULATORY_CARE_PROVIDER_SITE_OTHER): Payer: Self-pay | Admitting: Sports Medicine

## 2016-01-02 NOTE — Telephone Encounter (Signed)
Patient called asked for a call back when the release form she left yesterday and office note is faxed to Ms. Katlyn. The number to contact patient is [phone]

## 2016-01-05 NOTE — Telephone Encounter (Signed)
Talked with patient and advised her that a fax number was needed to fax office note.  Patient stated that note was already received and that the case has been settled.

## 2016-02-05 ENCOUNTER — Other Ambulatory Visit: Payer: Self-pay

## 2016-03-25 ENCOUNTER — Encounter: Payer: Self-pay | Admitting: Family Medicine

## 2016-03-30 ENCOUNTER — Encounter: Payer: Self-pay | Admitting: Family Medicine

## 2016-04-08 ENCOUNTER — Encounter: Payer: Self-pay | Admitting: Gastroenterology

## 2016-04-08 ENCOUNTER — Encounter: Payer: Self-pay | Admitting: Family Medicine

## 2016-04-08 ENCOUNTER — Ambulatory Visit (INDEPENDENT_AMBULATORY_CARE_PROVIDER_SITE_OTHER): Payer: BLUE CROSS/BLUE SHIELD | Admitting: Family Medicine

## 2016-04-08 VITALS — BP 118/68 | HR 69 | Temp 98.3°F | Ht 62.0 in | Wt 225.0 lb

## 2016-04-08 DIAGNOSIS — Z1211 Encounter for screening for malignant neoplasm of colon: Secondary | ICD-10-CM

## 2016-04-08 DIAGNOSIS — Z8639 Personal history of other endocrine, nutritional and metabolic disease: Secondary | ICD-10-CM | POA: Diagnosis not present

## 2016-04-08 DIAGNOSIS — Z1239 Encounter for other screening for malignant neoplasm of breast: Secondary | ICD-10-CM

## 2016-04-08 DIAGNOSIS — Z7689 Persons encountering health services in other specified circumstances: Secondary | ICD-10-CM | POA: Diagnosis not present

## 2016-04-08 DIAGNOSIS — Z0000 Encounter for general adult medical examination without abnormal findings: Secondary | ICD-10-CM

## 2016-04-08 DIAGNOSIS — Z862 Personal history of diseases of the blood and blood-forming organs and certain disorders involving the immune mechanism: Secondary | ICD-10-CM

## 2016-04-08 DIAGNOSIS — Z1231 Encounter for screening mammogram for malignant neoplasm of breast: Secondary | ICD-10-CM | POA: Diagnosis not present

## 2016-04-08 DIAGNOSIS — Z23 Encounter for immunization: Secondary | ICD-10-CM

## 2016-04-08 DIAGNOSIS — M25562 Pain in left knee: Secondary | ICD-10-CM | POA: Diagnosis not present

## 2016-04-08 LAB — COMPLETE METABOLIC PANEL WITH GFR
ALBUMIN: 4 g/dL (ref 3.6–5.1)
ALK PHOS: 69 U/L (ref 33–130)
ALT: 10 U/L (ref 6–29)
AST: 16 U/L (ref 10–35)
BUN: 17 mg/dL (ref 7–25)
CALCIUM: 9 mg/dL (ref 8.6–10.4)
CO2: 23 mmol/L (ref 20–31)
CREATININE: 0.61 mg/dL (ref 0.50–1.05)
Chloride: 105 mmol/L (ref 98–110)
GFR, Est African American: >89 mL/min (ref >=60)
GFR, Est Non African American: >89 mL/min (ref >=60)
GLUCOSE: 83 mg/dL (ref 65–99)
Potassium: 4.4 mmol/L (ref 3.5–5.3)
Sodium: 139 mmol/L (ref 135–146)
TOTAL PROTEIN: 7.2 g/dL (ref 6.1–8.1)
Total Bilirubin: 0.3 mg/dL (ref 0.2–1.2)

## 2016-04-08 LAB — CBC WITH DIFFERENTIAL/PLATELET
BASOS PCT: 0 %
Basophils Absolute: 0 cells/uL (ref 0–200)
Eosinophils Absolute: 144 cells/uL (ref 15–500)
Eosinophils Relative: 2 %
HCT: 37 % (ref 35.0–45.0)
HEMOGLOBIN: 12 g/dL (ref 11.7–15.5)
LYMPHS ABS: 2664 {cells}/uL (ref 850–3900)
Lymphocytes Relative: 37 %
MCH: 26 pg — ABNORMAL LOW (ref 27.0–33.0)
MCHC: 32.4 g/dL (ref 32.0–36.0)
MCV: 80.1 fL (ref 80.0–100.0)
MONO ABS: 648 {cells}/uL (ref 200–950)
MPV: 9.8 fL (ref 7.5–12.5)
Monocytes Relative: 9 %
NEUTROS ABS: 3744 {cells}/uL (ref 1500–7800)
NEUTROS PCT: 52 %
Platelets: 307 10*3/uL (ref 140–400)
RBC: 4.62 MIL/uL (ref 3.80–5.10)
RDW: 17.4 % — ABNORMAL HIGH (ref 11.0–15.0)
WBC: 7.2 10*3/uL (ref 4.0–10.5)

## 2016-04-08 LAB — POCT URINALYSIS DIPSTICK
Bilirubin, UA: NEGATIVE
Blood, UA: NEGATIVE
GLUCOSE UA: NEGATIVE
Ketones, UA: NEGATIVE
Leukocytes, UA: NEGATIVE
Nitrite, UA: NEGATIVE
Protein, UA: NEGATIVE
SPEC GRAV UA: 1.03 (ref 1.030–1.035)
UROBILINOGEN UA: NEGATIVE (ref ?–2.0)
pH, UA: 6 (ref 5.0–8.0)

## 2016-04-08 LAB — TSH: TSH: 0.42 mIU/L

## 2016-04-08 NOTE — Progress Notes (Signed)
Subjective:    Patient ID: Kathryn Miller, female    DOB: Oct 24, 1965, 51 y.o.   MRN: [id number]  HPI Chief Complaint  Patient presents with  . new pt cpe    new pt cpe, knee pain when bending, bronchitis flaring up   She is new to the practice and here for a complete physical exam. Previous medical care: [facility] but no since at least 7 years ago Last CPE: 7 years ago  Left knee pain for the past 2 weeks. No known injury. No history of knee pain or surgery. Pain is medial and anterior and worse with laying down. Walking does not make the pain worse. Has taken an occasional motrin and this helps.  No locking or popping or giving away.  Chronic intermittent right ankle pain and swelling. No issues with this today.   Other providers: orthopedist Dr. Berline Choughigby  Past medical history: anemia  Surgeries: 4 cesarean section   Social history: Lives with husband and 2 kids, has 4 kids,  is not working currently  Denies smoking, drinking alcohol, drug use  Diet: fairly healthy, not a lot of salt or fried food Excerise: not currently   Immunizations: TB test last month. Tetanus - more than 10 years.   Health maintenance:  Mammogram: 7 years ago  Colonoscopy: never  Last Pap smear: 5 years ago. Would like to come back for this.  Last Menstrual cycle: today. Started menses at age 89. Heavy bleeding and seems to be getting worse. Becoming irregular  Pregnancies: 4 Last Dental Exam: years Last Eye Exam: last month, [facility] on [address].   Wears seatbelt always, smoke detectors in home and functioning, does not text while driving and feels safe in home environment. She takes the bus.   Reviewed allergies, medications, past medical, surgical, family, and social history.    Review of Systems Review of Systems Constitutional: -fever, -chills, -sweats, -unexpected weight change,-fatigue ENT: -runny nose, -ear pain, -sore throat Cardiology:  -chest pain, -palpitations, -edema Respiratory:  -cough, -shortness of breath, -wheezing Gastroenterology: -abdominal pain, -nausea, -vomiting, -diarrhea, -constipation  Hematology: -bleeding or bruising problems Musculoskeletal: +arthralgias (left knee pain), -myalgias, -joint swelling, -back pain Ophthalmology: -vision changes Urology: -dysuria, -difficulty urinating, -hematuria, -urinary frequency, -urgency Neurology: -headache, -weakness, -tingling, -numbness        Objective:   Physical Exam BP 118/68   Pulse 69   Temp 98.3 F (36.8 C) (Oral)   Ht 5\' 2"  (1.575 m)   Wt 225 lb (102.1 kg)   LMP 04/05/2016   SpO2 98%   BMI 41.15 kg/m   General Appearance:    Alert, cooperative, no distress, appears stated age  Head:    Normocephalic, without obvious abnormality, atraumatic  Eyes:    PERRL, conjunctiva/corneas clear, EOM's intact, fundi    benign  Ears:    Normal TM's and external ear canals  Nose:   Nares normal, mucosa normal, no drainage or sinus   tenderness  Throat:   Lips, mucosa, and tongue normal; teeth and gums normal  Neck:   Supple, no lymphadenopathy;  thyroid:  no   enlargement/tenderness/nodules; no carotid   bruit or JVD  Back:    Spine nontender, no curvature, ROM normal, no CVA     tenderness  Lungs:     Clear to auscultation bilaterally without wheezes, rales or     ronchi; respirations unlabored  Chest Wall:    No tenderness or deformity   Heart:    Regular rate and  rhythm, S1 and S2 normal,  Murmur present (since childhood), no rub   or gallop  Breast Exam:    No tenderness, masses, or nipple discharge or inversion.      No axillary lymphadenopathy. Well - healed scars noted to bilateral chest wall.   Abdomen:     Soft, non-tender, nondistended, normoactive bowel sounds,    no masses, no hepatosplenomegaly. Well-healed scars to abdomen.   Genitalia:    Refused due to being on her menstrual cycles. Will return for pap smear   Rectal:    Referral to GI.   Extremities:   No clubbing, cyanosis or edema.  Left knee without erythema, edema, or effusion.  Non tender. Normal ROM and strength. No laxity, no crepitus.  Negative McMurray's.   Pulses:   2+ and symmetric all extremities  Skin:   Skin color, texture, turgor normal, no rashes or lesions  Lymph nodes:   Cervical, supraclavicular, and axillary nodes normal  Neurologic:   CNII-XII intact, normal strength, sensation and gait; reflexes 2+ and symmetric throughout          Psych:   Normal mood, affect, hygiene and grooming.    Urinalysis dipstick: neg     Assessment & Plan:  Routine general medical examination at a health care facility - Plan: Urinalysis Dipstick, CBC with Differential/Platelet, COMPLETE METABOLIC PANEL WITH GFR  Encounter to establish care  Acute pain of left knee  History of thyroid disease - Plan: TSH  History of anemia - Plan: CBC with Differential/Platelet  Need for Tdap vaccination - Plan: Tdap vaccine greater than or equal to 7yo IM  Screen for colon cancer - Plan: Ambulatory referral to Gastroenterology  Screening for breast cancer - Plan: MM DIGITAL SCREENING BILATERAL  Morbid obesity (HCC)  She has not had a PCP in several years and needs updated on health maintenance and immunization.  Will check labs to check status of thyroid  Left knee pain- exam is unremarkable. Recommend conservative treatment with anti-inflammatory, rest, ice as needed. She will let me know if this is not improving.  Counseled on obesity and the increased risk for diabetes, HTN, heart disease and other health conditions.  Mammogram ordered.  GI referral for colonoscopy.  Tdap given.  Will follow up pending labs.  She will return for a pap smear and fasting lipids.

## 2016-04-08 NOTE — Patient Instructions (Addendum)
Return for fasting labs (nothing to eat or drink but water for at least 6 hours) and pap smear.   Call the breast center and schedule your mammogram.  The GI office will call you to schedule to discuss a colonoscopy.  We will call you with lab results.   Try taking 600 mg of Ibuprofen 3 times per day with food for the next week and see if this helps with your knee pain.  Pay close attention to portion sizes and calorie information and try to cut back.  Start walking for exercise or some type of physical activity. At least 150 minutes per week.   Preventative Care for Adults - Female      MAINTAIN REGULAR HEALTH EXAMS:  A routine yearly physical is a good way to check in with your primary care provider about your health and preventive screening. It is also an opportunity to share updates about your health and any concerns you have, and receive a thorough all-over exam.   Most health insurance companies pay for at least some preventative services.  Check with your health plan for specific coverages.  WHAT PREVENTATIVE SERVICES DO WOMEN NEED?  Adult women should have their weight and blood pressure checked regularly.   Women age 52 and older should have their cholesterol levels checked regularly.  Women should be screened for cervical cancer with a Pap smear and pelvic exam beginning at either age 52, or 3 years after they become sexually activity.    Breast cancer screening generally begins at age 52 with a mammogram and breast exam by your primary care provider.    Beginning at age 52 and continuing to age 52, women should be screened for colorectal cancer.  Certain people may need continued testing until age 52.  Updating vaccinations is part of preventative care.  Vaccinations help protect against diseases such as the flu.  Osteoporosis is a disease in which the bones lose minerals and strength as we age. Women ages 52 and over should discuss this with their caregivers, as should  women after menopause who have other risk factors.  Lab tests are generally done as part of preventative care to screen for anemia and blood disorders, to screen for problems with the kidneys and liver, to screen for bladder problems, to check blood sugar, and to check your cholesterol level.  Preventative services generally include counseling about diet, exercise, avoiding tobacco, drugs, excessive alcohol consumption, and sexually transmitted infections.    GENERAL RECOMMENDATIONS FOR GOOD HEALTH:  Healthy diet:  Eat a variety of foods, including fruit, vegetables, animal or vegetable protein, such as meat, fish, chicken, and eggs, or beans, lentils, tofu, and grains, such as rice.  Drink plenty of water daily.  Decrease saturated fat in the diet, avoid lots of red meat, processed foods, sweets, fast foods, and fried foods.  Exercise:  Aerobic exercise helps maintain good heart health. At least 30-40 minutes of moderate-intensity exercise is recommended. For example, a brisk walk that increases your heart rate and breathing. This should be done on most days of the week.   Find a type of exercise or a variety of exercises that you enjoy so that it becomes a part of your daily life.  Examples are running, walking, swimming, water aerobics, and biking.  For motivation and support, explore group exercise such as aerobic class, spin class, Zumba, Yoga,or  martial arts, etc.    Set exercise goals for yourself, such as a certain weight goal, walk or  run in a race such as a 5k walk/run.  Speak to your primary care provider about exercise goals.  Disease prevention:  If you smoke or chew tobacco, find out from your caregiver how to quit. It can literally save your life, no matter how long you have been a tobacco user. If you do not use tobacco, never begin.   Maintain a healthy diet and normal weight. Increased weight leads to problems with blood pressure and diabetes.   The Body Mass Index or  BMI is a way of measuring how much of your body is fat. Having a BMI above 27 increases the risk of heart disease, diabetes, hypertension, stroke and other problems related to obesity. Your caregiver can help determine your BMI and based on it develop an exercise and dietary program to help you achieve or maintain this important measurement at a healthful level.  High blood pressure causes heart and blood vessel problems.  Persistent high blood pressure should be treated with medicine if weight loss and exercise do not work.   Fat and cholesterol leaves deposits in your arteries that can block them. This causes heart disease and vessel disease elsewhere in your body.  If your cholesterol is found to be high, or if you have heart disease or certain other medical conditions, then you may need to have your cholesterol monitored frequently and be treated with medication.   Ask if you should have a cardiac stress test if your history suggests this. A stress test is a test done on a treadmill that looks for heart disease. This test can find disease prior to there being a problem.  Menopause can be associated with physical symptoms and risks. Hormone replacement therapy is available to decrease these. You should talk to your caregiver about whether starting or continuing to take hormones is right for you.   Osteoporosis is a disease in which the bones lose minerals and strength as we age. This can result in serious bone fractures. Risk of osteoporosis can be identified using a bone density scan. Women ages 52 and over should discuss this with their caregivers, as should women after menopause who have other risk factors. Ask your caregiver whether you should be taking a calcium supplement and Vitamin D, to reduce the rate of osteoporosis.   Avoid drinking alcohol in excess (more than two drinks per day).  Avoid use of street drugs. Do not share needles with anyone. Ask for professional help if you need  assistance or instructions on stopping the use of alcohol, cigarettes, and/or drugs.  Brush your teeth twice a day with fluoride toothpaste, and floss once a day. Good oral hygiene prevents tooth decay and gum disease. The problems can be painful, unattractive, and can cause other health problems. Visit your dentist for a routine oral and dental check up and preventive care every 6-12 months.   Look at your skin regularly.  Use a mirror to look at your back. Notify your caregivers of changes in moles, especially if there are changes in shapes, colors, a size larger than a pencil eraser, an irregular border, or development of new moles.  Safety:  Use seatbelts 100% of the time, whether driving or as a passenger.  Use safety devices such as hearing protection if you work in environments with loud noise or significant background noise.  Use safety glasses when doing any work that could send debris in to the eyes.  Use a helmet if you ride a bike or motorcycle.  Use appropriate safety gear for contact sports.  Talk to your caregiver about gun safety.  Use sunscreen with a SPF (or skin protection factor) of 15 or greater.  Lighter skinned people are at a greater risk of skin cancer. Don't forget to also wear sunglasses in order to protect your eyes from too much damaging sunlight. Damaging sunlight can accelerate cataract formation.   Practice safe sex. Use condoms. Condoms are used for birth control and to help reduce the spread of sexually transmitted infections (or STIs).  Some of the STIs are gonorrhea (the clap), chlamydia, syphilis, trichomonas, herpes, HPV (human papilloma virus) and HIV (human immunodeficiency virus) which causes AIDS. The herpes, HIV and HPV are viral illnesses that have no cure. These can result in disability, cancer and death.   Keep carbon monoxide and smoke detectors in your home functioning at all times. Change the batteries every 6 months or use a model that plugs into the  wall.   Vaccinations:  Stay up to date with your tetanus shots and other required immunizations. You should have a booster for tetanus every 10 years. Be sure to get your flu shot every year, since 5%-20% of the [place] population comes down with the flu. The flu vaccine changes each year, so being vaccinated once is not enough. Get your shot in the fall, before the flu season peaks.   Other vaccines to consider:  Human Papilloma Virus or HPV causes cancer of the cervix, and other infections that can be transmitted from person to person. There is a vaccine for HPV, and females should get immunized between the ages of 61 and 59. It requires a series of 3 shots.   Pneumococcal vaccine to protect against certain types of pneumonia.  This is normally recommended for adults age 14 or older.  However, adults younger than 52 years old with certain underlying conditions such as diabetes, heart or lung disease should also receive the vaccine.  Shingles vaccine to protect against Varicella Zoster if you are older than age 43, or younger than 52 years old with certain underlying illness.  Hepatitis A vaccine to protect against a form of infection of the liver by a virus acquired from food.  Hepatitis B vaccine to protect against a form of infection of the liver by a virus acquired from blood or body fluids, particularly if you work in health care.  If you plan to travel internationally, check with your local health department for specific vaccination recommendations.  Cancer Screening:  Breast cancer screening is essential to preventive care for women. All women age 28 and older should perform a breast self-exam every month. At age 31 and older, women should have their caregiver complete a breast exam each year. Women at ages 69 and older should have a mammogram (x-ray film) of the breasts. Your caregiver can discuss how often you need mammograms.    Cervical cancer screening includes taking a Pap smear  (sample of cells examined under a microscope) from the cervix (end of the uterus). It also includes testing for HPV (Human Papilloma Virus, which can cause cervical cancer). Screening and a pelvic exam should begin at age 29, or 3 years after a woman becomes sexually active. Screening should occur every year, with a Pap smear but no HPV testing, up to age 13. After age 62, you should have a Pap smear every 3 years with HPV testing, if no HPV was found previously.   Most routine colon cancer screening begins at the  age of 62. On a yearly basis, doctors may provide special easy to use take-home tests to check for hidden blood in the stool. Sigmoidoscopy or colonoscopy can detect the earliest forms of colon cancer and is life saving. These tests use a small camera at the end of a tube to directly examine the colon. Speak to your caregiver about this at age 48, when routine screening begins (and is repeated every 5 years unless early forms of pre-cancerous polyps or small growths are found).    Calorie Counting for Weight Loss Calories are units of energy. Your body needs a certain amount of calories from food to keep you going throughout the day. When you eat more calories than your body needs, your body stores the extra calories as fat. When you eat fewer calories than your body needs, your body burns fat to get the energy it needs. Calorie counting means keeping track of how many calories you eat and drink each day. Calorie counting can be helpful if you need to lose weight. If you make sure to eat fewer calories than your body needs, you should lose weight. Ask your health care provider what a healthy weight is for you. For calorie counting to work, you will need to eat the right number of calories in a day in order to lose a healthy amount of weight per week. A dietitian can help you determine how many calories you need in a day and will give you suggestions on how to reach your calorie goal.  A healthy  amount of weight to lose per week is usually 1-2 lb (0.5-0.9 kg). This usually means that your daily calorie intake should be reduced by 500-750 calories.  Eating 1,200 - 1,500 calories per day can help most women lose weight.  Eating 1,500 - 1,800 calories per day can help most men lose weight. What is my plan? My goal is to have __________ calories per day. If I have this many calories per day, I should lose around __________ pounds per week. What do I need to know about calorie counting? In order to meet your daily calorie goal, you will need to:  Find out how many calories are in each food you would like to eat. Try to do this before you eat.  Decide how much of the food you plan to eat.  Write down what you ate and how many calories it had. Doing this is called keeping a food log. To successfully lose weight, it is important to balance calorie counting with a healthy lifestyle that includes regular activity. Aim for 150 minutes of moderate exercise (such as walking) or 75 minutes of vigorous exercise (such as running) each week. Where do I find calorie information?   The number of calories in a food can be found on a Nutrition Facts label. If a food does not have a Nutrition Facts label, try to look up the calories online or ask your dietitian for help. Remember that calories are listed per serving. If you choose to have more than one serving of a food, you will have to multiply the calories per serving by the amount of servings you plan to eat. For example, the label on a package of bread might say that a serving size is 1 slice and that there are 90 calories in a serving. If you eat 1 slice, you will have eaten 90 calories. If you eat 2 slices, you will have eaten 180 calories. How do I keep a  food log? Immediately after each meal, record the following information in your food log:  What you ate. Don't forget to include toppings, sauces, and other extras on the food.  How much you  ate. This can be measured in cups, ounces, or number of items.  How many calories each food and drink had.  The total number of calories in the meal. Keep your food log near you, such as in a small notebook in your pocket, or use a mobile app or website. Some programs will calculate calories for you and show you how many calories you have left for the day to meet your goal. What are some calorie counting tips?  Use your calories on foods and drinks that will fill you up and not leave you hungry:  Some examples of foods that fill you up are nuts and nut butters, vegetables, lean proteins, and high-fiber foods like whole grains. High-fiber foods are foods with more than 5 g fiber per serving.  Drinks such as sodas, specialty coffee drinks, alcohol, and juices have a lot of calories, yet do not fill you up.  Eat nutritious foods and avoid empty calories. Empty calories are calories you get from foods or beverages that do not have many vitamins or protein, such as candy, sweets, and soda. It is better to have a nutritious high-calorie food (such as an avocado) than a food with few nutrients (such as a bag of chips).  Know how many calories are in the foods you eat most often. This will help you calculate calorie counts faster.  Pay attention to calories in drinks. Low-calorie drinks include water and unsweetened drinks.  Pay attention to nutrition labels for "low fat" or "fat free" foods. These foods sometimes have the same amount of calories or more calories than the full fat versions. They also often have added sugar, starch, or salt, to make up for flavor that was removed with the fat.  Find a way of tracking calories that works for you. Get creative. Try different apps or programs if writing down calories does not work for you. What are some portion control tips?  Know how many calories are in a serving. This will help you know how many servings of a certain food you can have.  Use a  measuring cup to measure serving sizes. You could also try weighing out portions on a kitchen scale. With time, you will be able to estimate serving sizes for some foods.  Take some time to put servings of different foods on your favorite plates, bowls, and cups so you know what a serving looks like.  Try not to eat straight from a bag or box. Doing this can lead to overeating. Put the amount you would like to eat in a cup or on a plate to make sure you are eating the right portion.  Use smaller plates, glasses, and bowls to prevent overeating.  Try not to multitask (for example, watch TV or use your computer) while eating. If it is time to eat, sit down at a table and enjoy your food. This will help you to know when you are full. It will also help you to be aware of what you are eating and how much you are eating. What are tips for following this plan? Reading food labels   Check the calorie count compared to the serving size. The serving size may be smaller than what you are used to eating.  Check the source of the calories.  Make sure the food you are eating is high in vitamins and protein and low in saturated and trans fats. Shopping   Read nutrition labels while you shop. This will help you make healthy decisions before you decide to purchase your food.  Make a grocery list and stick to it. Cooking   Try to cook your favorite foods in a healthier way. For example, try baking instead of frying.  Use low-fat dairy products. Meal planning   Use more fruits and vegetables. Half of your plate should be fruits and vegetables.  Include lean proteins like poultry and fish. How do I count calories when eating out?  Ask for smaller portion sizes.  Consider sharing an entree and sides instead of getting your own entree.  If you get your own entree, eat only half. Ask for a box at the beginning of your meal and put the rest of your entree in it so you are not tempted to eat it.  If  calories are listed on the menu, choose the lower calorie options.  Choose dishes that include vegetables, fruits, whole grains, low-fat dairy products, and lean protein.  Choose items that are boiled, broiled, grilled, or steamed. Stay away from items that are buttered, battered, fried, or served with cream sauce. Items labeled "crispy" are usually fried, unless stated otherwise.  Choose water, low-fat milk, unsweetened iced tea, or other drinks without added sugar. If you want an alcoholic beverage, choose a lower calorie option such as a glass of wine or light beer.  Ask for dressings, sauces, and syrups on the side. These are usually high in calories, so you should limit the amount you eat.  If you want a salad, choose a garden salad and ask for grilled meats. Avoid extra toppings like bacon, cheese, or fried items. Ask for the dressing on the side, or ask for olive oil and vinegar or lemon to use as dressing.  Estimate how many servings of a food you are given. For example, a serving of cooked rice is  cup or about the size of half a baseball. Knowing serving sizes will help you be aware of how much food you are eating at restaurants. The list below tells you how big or small some common portion sizes are based on everyday objects:  1 oz-4 stacked dice.  3 oz-1 deck of cards.  1 tsp-1 die.  1 Tbsp- a ping-pong ball.  2 Tbsp-1 ping-pong ball.   cup- baseball.  1 cup-1 baseball. Summary  Calorie counting means keeping track of how many calories you eat and drink each day. If you eat fewer calories than your body needs, you should lose weight.  A healthy amount of weight to lose per week is usually 1-2 lb (0.5-0.9 kg). This usually means reducing your daily calorie intake by 500-750 calories.  The number of calories in a food can be found on a Nutrition Facts label. If a food does not have a Nutrition Facts label, try to look up the calories online or ask your dietitian for  help.  Use your calories on foods and drinks that will fill you up, and not on foods and drinks that will leave you hungry.  Use smaller plates, glasses, and bowls to prevent overeating. This information is not intended to replace advice given to you by your health care provider. Make sure you discuss any questions you have with your health care provider. Document Released: 01/04/2005 Document Revised: 12/05/2015 Document Reviewed: 12/05/2015 Elsevier Interactive  Patient Education  2017 [employer].

## 2016-04-09 DIAGNOSIS — Z23 Encounter for immunization: Secondary | ICD-10-CM | POA: Diagnosis not present

## 2016-04-14 ENCOUNTER — Encounter: Payer: Self-pay | Admitting: Family Medicine

## 2016-04-26 ENCOUNTER — Other Ambulatory Visit (INDEPENDENT_AMBULATORY_CARE_PROVIDER_SITE_OTHER): Payer: BLUE CROSS/BLUE SHIELD

## 2016-04-26 DIAGNOSIS — Z111 Encounter for screening for respiratory tuberculosis: Secondary | ICD-10-CM | POA: Diagnosis not present

## 2016-05-04 ENCOUNTER — Encounter: Payer: Self-pay | Admitting: Family Medicine

## 2016-05-05 ENCOUNTER — Ambulatory Visit (INDEPENDENT_AMBULATORY_CARE_PROVIDER_SITE_OTHER): Payer: BLUE CROSS/BLUE SHIELD | Admitting: Family Medicine

## 2016-05-05 ENCOUNTER — Other Ambulatory Visit (HOSPITAL_COMMUNITY)
Admission: RE | Admit: 2016-05-05 | Discharge: 2016-05-05 | Disposition: A | Discharge status: Home / Self Care | Payer: BLUE CROSS/BLUE SHIELD | Source: Ambulatory Visit | Attending: Family Medicine | Admitting: Family Medicine

## 2016-05-05 ENCOUNTER — Encounter: Payer: Self-pay | Admitting: Family Medicine

## 2016-05-05 VITALS — BP 120/70 | HR 58 | Wt 217.8 lb

## 2016-05-05 DIAGNOSIS — Z0000 Encounter for general adult medical examination without abnormal findings: Secondary | ICD-10-CM | POA: Diagnosis not present

## 2016-05-05 DIAGNOSIS — Z124 Encounter for screening for malignant neoplasm of cervix: Secondary | ICD-10-CM | POA: Insufficient documentation

## 2016-05-05 DIAGNOSIS — Z113 Encounter for screening for infections with a predominantly sexual mode of transmission: Secondary | ICD-10-CM | POA: Diagnosis not present

## 2016-05-05 LAB — LIPID PANEL
Cholesterol: 179 mg/dL (ref <200)
HDL: 68 mg/dL (ref >50)
LDL CALC: 101 mg/dL — AB (ref <100)
TRIGLYCERIDES: 49 mg/dL (ref <150)
Total CHOL/HDL Ratio: 2.6 Ratio (ref <5.0)
VLDL: 10 mg/dL (ref <30)

## 2016-05-05 NOTE — Progress Notes (Signed)
   Subjective:    Patient ID: Kathryn Miller, female    DOB: September 02, 1965, 51 y.o.   MRN: [id number]  HPI Chief Complaint  Patient presents with  . pap and labs    pap and labs   She is here for a pap smear. Last pap smear was 7 years ago.  positive history of abnormal pap smears in the distant past per patient.  No new sexual partners. Denies history of STIs. No concerns or complaints today.  She would like STD testing.   LMP: 04/05/2016 Contraception: tubal ligation.  4 pregnancies.   Denies fever, chills, abdominal pain, back pain, N/V/D or urinary symptoms. No vaginal discharge or irritation.   Reviewed allergies, medications, past medical, surgical, and social history.    Review of Systems Pertinent positives and negatives in the history of present illness.     Objective:   Physical Exam  Constitutional: She appears well-developed and well-nourished. No distress.  Abdominal: Normal appearance. There is no tenderness. Hernia confirmed negative in the right inguinal area and confirmed negative in the left inguinal area.  Genitourinary: Vagina normal and uterus normal. There is no rash, tenderness or lesion on the right labia. There is no rash, tenderness or lesion on the left labia. Uterus is not tender. Cervix exhibits no motion tenderness, no discharge and no friability. Right adnexum displays no mass, no tenderness and no fullness. Left adnexum displays no mass, no tenderness and no fullness.  Lymphadenopathy:       Right: No inguinal adenopathy present.       Left: No inguinal adenopathy present.  Skin: Skin is warm and dry. No pallor.   BP 120/70   Pulse (!) 58   Wt 217 lb 12.8 oz (98.8 kg)   LMP 04/05/2016   BMI 39.84 kg/m       Assessment & Plan:  Encounter for Papanicolaou smear for cervical cancer screening  Screen for STD (sexually transmitted disease)  Routine general medical examination at a health care facility  Pap smear performed. Chaperone  present and she tolerated this well. STD testing done per patient request.  Fasting lipids checked since she was not fasting at her CPE.   States she has her mammogram and colonoscopy scheduled.  Follow up pending results.

## 2016-05-06 ENCOUNTER — Encounter: Payer: Self-pay | Admitting: Internal Medicine

## 2016-05-06 ENCOUNTER — Encounter: Payer: Self-pay | Admitting: Family Medicine

## 2016-05-06 LAB — RPR

## 2016-05-06 LAB — CYTOLOGY - PAP
Chlamydia: NEGATIVE
DIAGNOSIS: NEGATIVE
HPV: NOT DETECTED
Neisseria Gonorrhea: NEGATIVE

## 2016-05-06 LAB — HIV ANTIBODY (ROUTINE TESTING W REFLEX): HIV 1&2 Ab, 4th Generation: NONREACTIVE

## 2016-06-09 ENCOUNTER — Encounter: Payer: Self-pay | Admitting: Gastroenterology

## 2016-07-13 ENCOUNTER — Encounter (HOSPITAL_COMMUNITY): Payer: Self-pay | Admitting: *Deleted

## 2016-07-13 ENCOUNTER — Emergency Department (HOSPITAL_COMMUNITY): Payer: BLUE CROSS/BLUE SHIELD

## 2016-07-13 DIAGNOSIS — R0602 Shortness of breath: Secondary | ICD-10-CM | POA: Diagnosis not present

## 2016-07-13 DIAGNOSIS — R1013 Epigastric pain: Secondary | ICD-10-CM | POA: Insufficient documentation

## 2016-07-13 DIAGNOSIS — Z79899 Other long term (current) drug therapy: Secondary | ICD-10-CM | POA: Insufficient documentation

## 2016-07-13 DIAGNOSIS — R0789 Other chest pain: Secondary | ICD-10-CM | POA: Insufficient documentation

## 2016-07-13 DIAGNOSIS — R42 Dizziness and giddiness: Secondary | ICD-10-CM | POA: Insufficient documentation

## 2016-07-13 DIAGNOSIS — R05 Cough: Secondary | ICD-10-CM | POA: Insufficient documentation

## 2016-07-13 DIAGNOSIS — Z5321 Procedure and treatment not carried out due to patient leaving prior to being seen by health care provider: Secondary | ICD-10-CM

## 2016-07-13 DIAGNOSIS — R079 Chest pain, unspecified: Secondary | ICD-10-CM

## 2016-07-13 LAB — CBC
HEMATOCRIT: 35.7 % — AB (ref 36.0–46.0)
HEMOGLOBIN: 12.2 g/dL (ref 12.0–15.0)
MCH: 26.3 pg (ref 26.0–34.0)
MCHC: 34.2 g/dL (ref 30.0–36.0)
MCV: 77.1 fL — AB (ref 78.0–100.0)
Platelets: 272 10*3/uL (ref 150–400)
RBC: 4.63 MIL/uL (ref 3.87–5.11)
RDW: 15.9 % — ABNORMAL HIGH (ref 11.5–15.5)
WBC: 11.1 10*3/uL — AB (ref 4.0–10.5)

## 2016-07-13 LAB — BASIC METABOLIC PANEL
Anion gap: 8 (ref 5–15)
BUN: 17 mg/dL (ref 6–20)
CHLORIDE: 104 mmol/L (ref 101–111)
CO2: 26 mmol/L (ref 22–32)
Calcium: 9.2 mg/dL (ref 8.9–10.3)
Creatinine, Ser: 0.48 mg/dL (ref 0.44–1.00)
GFR calc non Af Amer: >60 mL/min (ref >60)
Glucose, Bld: 80 mg/dL (ref 65–99)
POTASSIUM: 3.7 mmol/L (ref 3.5–5.1)
SODIUM: 138 mmol/L (ref 135–145)

## 2016-07-13 LAB — POCT I-STAT TROPONIN I: Troponin i, poc: 0.01 ng/mL (ref 0.00–0.08)

## 2016-07-13 NOTE — ED Notes (Signed)
Pt called for triage and EKG. Not in lobby.

## 2016-07-13 NOTE — ED Triage Notes (Signed)
Pt reports L chest pain, under her L breast, started yesterday radiating to her L arm.  Pt reports cp is constant, today pain radiates to the R side, under her breast.  Pt reports pain is worse when taking a deep breath.  Describes pain as sharp.  Pt also reports SOB and dizziness yesterday with the cp-denies any today.  Pt is in NAD.  Pt is A&Ox 4.

## 2016-07-14 ENCOUNTER — Emergency Department (HOSPITAL_COMMUNITY)
Admission: EM | Admit: 2016-07-14 | Discharge: 2016-07-14 | Disposition: A | Discharge status: Home / Self Care | Payer: BLUE CROSS/BLUE SHIELD | Source: Home / Self Care

## 2016-07-14 ENCOUNTER — Emergency Department (HOSPITAL_COMMUNITY)
Admission: EM | Admit: 2016-07-14 | Discharge: 2016-07-14 | Disposition: A | Discharge status: Home / Self Care | Payer: BLUE CROSS/BLUE SHIELD | Attending: Emergency Medicine | Admitting: Emergency Medicine

## 2016-07-14 ENCOUNTER — Encounter (HOSPITAL_COMMUNITY): Payer: Self-pay | Admitting: *Deleted

## 2016-07-14 ENCOUNTER — Other Ambulatory Visit: Payer: Self-pay

## 2016-07-14 DIAGNOSIS — R0789 Other chest pain: Secondary | ICD-10-CM

## 2016-07-14 LAB — D-DIMER, QUANTITATIVE (NOT AT ARMC): D DIMER QUANT: 0.33 ug{FEU}/mL (ref 0.00–0.50)

## 2016-07-14 LAB — LIPASE, BLOOD: LIPASE: 11 U/L (ref 11–51)

## 2016-07-14 LAB — I-STAT TROPONIN, ED: Troponin i, poc: 0 ng/mL (ref 0.00–0.08)

## 2016-07-14 MED ORDER — GI COCKTAIL ~~LOC~~
30.0000 mL | Freq: Once | ORAL | Status: AC
  Administered 2016-07-14: 30 mL via ORAL
  Filled 2016-07-14: qty 30

## 2016-07-14 NOTE — ED Notes (Signed)
Successful attempt on pt's left hand for blood draw. Only gold & blue tube drawn.

## 2016-07-14 NOTE — ED Notes (Signed)
UNSUCCESSFUL ATTEMPT TO COLLECT BLOOD SAMPLES 
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
## 2016-07-14 NOTE — ED Notes (Signed)
Pt not in waiting room when called.   

## 2016-07-14 NOTE — Discharge Instructions (Signed)
As discussed, get some rest and use ibuprofen for pain. Follow up with your primary care provider as needed.  Return if symptoms worsen or you experience new concerning symptoms in the meantime.

## 2016-07-14 NOTE — ED Triage Notes (Signed)
Patient reports L chest pain, under her L breast, started yesterday radiating to her L aem.  Patiente reports chest pain is constant,  Today pain radiates to the R side, under her breast.  Patient reports pain is worse when taking a deep breath.  Describes pain as sharp.  Patient also reports SOB and dizziness.

## 2017-01-21 ENCOUNTER — Encounter (HOSPITAL_COMMUNITY): Payer: Self-pay

## 2017-01-21 ENCOUNTER — Emergency Department (HOSPITAL_COMMUNITY)
Admission: EM | Admit: 2017-01-21 | Discharge: 2017-01-21 | Disposition: A | Discharge status: Home / Self Care | Payer: Self-pay | Attending: Emergency Medicine | Admitting: Emergency Medicine

## 2017-01-21 ENCOUNTER — Other Ambulatory Visit: Payer: Self-pay

## 2017-01-21 ENCOUNTER — Emergency Department (HOSPITAL_COMMUNITY): Payer: Self-pay

## 2017-01-21 DIAGNOSIS — Z79899 Other long term (current) drug therapy: Secondary | ICD-10-CM | POA: Insufficient documentation

## 2017-01-21 DIAGNOSIS — M25431 Effusion, right wrist: Secondary | ICD-10-CM | POA: Insufficient documentation

## 2017-01-21 MED ORDER — PREDNISONE 10 MG PO TABS: 40.0000 mg | ORAL_TABLET | Freq: Every day | ORAL | 0 refills | Status: AC

## 2017-01-21 MED ORDER — PREDNISONE 20 MG PO TABS
40.0000 mg | ORAL_TABLET | Freq: Once | ORAL | Status: AC
  Administered 2017-01-21: 40 mg via ORAL
  Filled 2017-01-21: qty 2

## 2017-01-21 NOTE — Discharge Instructions (Signed)
Please read attached information regarding your condition.  Take steroid burst for the next 4 days beginning tomorrow. Wear wrist splint as directed, especially at night. Follow-up with your primary care provider for further testing and imaging if needed. Return to ED for worsening symptoms, red hot or tender joint, injuries or trauma to the area.
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
## 2017-01-21 NOTE — ED Triage Notes (Signed)
Pt reports some swelling and pain to her right wrist. No known injury. Strong radial pulse noted.

## 2017-02-02 ENCOUNTER — Other Ambulatory Visit (INDEPENDENT_AMBULATORY_CARE_PROVIDER_SITE_OTHER): Payer: Self-pay

## 2017-02-02 DIAGNOSIS — Z111 Encounter for screening for respiratory tuberculosis: Secondary | ICD-10-CM

## 2017-02-04 LAB — TB SKIN TEST
INDURATION: NEGATIVE mm
TB SKIN TEST: NEGATIVE

## 2017-03-11 ENCOUNTER — Encounter (HOSPITAL_COMMUNITY): Payer: Self-pay | Admitting: *Deleted

## 2017-03-11 ENCOUNTER — Emergency Department (HOSPITAL_COMMUNITY)
Admission: EM | Admit: 2017-03-11 | Discharge: 2017-03-11 | Disposition: A | Discharge status: Home / Self Care | Payer: Self-pay | Attending: Emergency Medicine | Admitting: Emergency Medicine

## 2017-03-11 ENCOUNTER — Emergency Department (HOSPITAL_COMMUNITY): Payer: Self-pay

## 2017-03-11 DIAGNOSIS — M79675 Pain in left toe(s): Secondary | ICD-10-CM | POA: Insufficient documentation

## 2017-03-11 NOTE — Discharge Instructions (Signed)
X-ray of the toe and foot is reassuring.  No broken bones.  Continue to soak the foot in warm water.  You can use ibuprofen every 6 hours as needed for pain.  You can buddy tape the toes at home for comfort.  Please schedule appointment with your primary care doctor if your pain is not improving in a week.  Return to the emergency department if you have fever greater than 100.4 F, noticed worsening swelling and redness over the foot or have any new or worsening symptoms.

## 2017-03-11 NOTE — ED Triage Notes (Signed)
Pt in c/o pain to her left 5th toe, reports redness and swelling, no history of diabetes, denies injury
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
## 2017-07-01 ENCOUNTER — Ambulatory Visit: Payer: Self-pay | Admitting: Family Medicine

## 2017-07-08 ENCOUNTER — Ambulatory Visit: Payer: Self-pay | Admitting: Family Medicine

## 2017-07-29 ENCOUNTER — Ambulatory Visit: Payer: Self-pay | Admitting: Family Medicine

## 2017-07-31 ENCOUNTER — Emergency Department (HOSPITAL_COMMUNITY)
Admission: EM | Admit: 2017-07-31 | Discharge: 2017-07-31 | Disposition: A | Discharge status: Home / Self Care | Payer: BLUE CROSS/BLUE SHIELD | Attending: Emergency Medicine | Admitting: Emergency Medicine

## 2017-07-31 ENCOUNTER — Other Ambulatory Visit: Payer: Self-pay

## 2017-07-31 ENCOUNTER — Encounter (HOSPITAL_COMMUNITY): Payer: Self-pay | Admitting: Emergency Medicine

## 2017-07-31 DIAGNOSIS — R51 Headache: Secondary | ICD-10-CM | POA: Insufficient documentation

## 2017-07-31 DIAGNOSIS — R519 Headache, unspecified: Secondary | ICD-10-CM

## 2017-07-31 DIAGNOSIS — M62838 Other muscle spasm: Secondary | ICD-10-CM | POA: Diagnosis present

## 2017-07-31 LAB — BASIC METABOLIC PANEL
Anion gap: 9 (ref 5–15)
BUN: 19 mg/dL (ref 6–20)
CALCIUM: 9.2 mg/dL (ref 8.9–10.3)
CO2: 25 mmol/L (ref 22–32)
CREATININE: 0.59 mg/dL (ref 0.44–1.00)
Chloride: 105 mmol/L (ref 98–111)
GFR calc Af Amer: >60 mL/min (ref >60)
GFR calc non Af Amer: >60 mL/min (ref >60)
GLUCOSE: 115 mg/dL — AB (ref 70–99)
Potassium: 3.6 mmol/L (ref 3.5–5.1)
Sodium: 139 mmol/L (ref 135–145)

## 2017-07-31 LAB — CBC
HCT: 36.9 % (ref 36.0–46.0)
Hemoglobin: 12.1 g/dL (ref 12.0–15.0)
MCH: 26.4 pg (ref 26.0–34.0)
MCHC: 32.8 g/dL (ref 30.0–36.0)
MCV: 80.6 fL (ref 78.0–100.0)
Platelets: 245 10*3/uL (ref 150–400)
RBC: 4.58 MIL/uL (ref 3.87–5.11)
RDW: 15.9 % — AB (ref 11.5–15.5)
WBC: 10.2 10*3/uL (ref 4.0–10.5)

## 2017-07-31 MED ORDER — METHOCARBAMOL 500 MG PO TABS: 500.0000 mg | ORAL_TABLET | Freq: Two times a day (BID) | ORAL | 0 refills | Status: DC

## 2017-07-31 MED ORDER — PREDNISONE 10 MG (21) PO TBPK: ORAL_TABLET | ORAL | 0 refills | Status: DC

## 2017-07-31 NOTE — Discharge Instructions (Addendum)
°  Take the prednisone, as directed, until finished. Robaxin is a muscle relaxer and may help with muscle spasms.  Use caution as this medication can cause drowsiness.  Do not drive or perform other dangerous activities while taking the Robaxin. Follow-up with your primary care provider as well as neurology on this matter. Return to the ED should symptoms worsen.

## 2017-07-31 NOTE — ED Triage Notes (Signed)
Pt reports high blood pressure today, as high as 198/78. Reports headaches on R side since yesterday. Jaw pain on the R side and "the back of my tongue is tingling."
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
## 2017-10-02 ENCOUNTER — Encounter (HOSPITAL_COMMUNITY): Payer: Self-pay

## 2017-10-02 ENCOUNTER — Emergency Department (HOSPITAL_COMMUNITY)
Admission: EM | Admit: 2017-10-02 | Discharge: 2017-10-02 | Disposition: A | Discharge status: Home / Self Care | Payer: BLUE CROSS/BLUE SHIELD | Attending: Emergency Medicine | Admitting: Emergency Medicine

## 2017-10-02 DIAGNOSIS — Z79899 Other long term (current) drug therapy: Secondary | ICD-10-CM | POA: Insufficient documentation

## 2017-10-02 DIAGNOSIS — M545 Low back pain, unspecified: Secondary | ICD-10-CM

## 2017-10-02 DIAGNOSIS — E039 Hypothyroidism, unspecified: Secondary | ICD-10-CM | POA: Insufficient documentation

## 2017-10-02 LAB — URINALYSIS, ROUTINE W REFLEX MICROSCOPIC
Bilirubin Urine: NEGATIVE
Glucose, UA: NEGATIVE mg/dL
KETONES UR: NEGATIVE mg/dL
LEUKOCYTES UA: NEGATIVE
NITRITE: NEGATIVE
PH: 5 (ref 5.0–8.0)
Protein, ur: NEGATIVE mg/dL
Specific Gravity, Urine: 1.03 (ref 1.005–1.030)

## 2017-10-02 MED ORDER — NAPROXEN 500 MG PO TABS: 500.0000 mg | ORAL_TABLET | Freq: Two times a day (BID) | ORAL | 0 refills | Status: DC

## 2017-10-02 MED ORDER — METHOCARBAMOL 500 MG PO TABS: 500.0000 mg | ORAL_TABLET | Freq: Two times a day (BID) | ORAL | 0 refills | Status: DC

## 2017-10-02 MED ORDER — KETOROLAC TROMETHAMINE 30 MG/ML IJ SOLN
30.0000 mg | Freq: Once | INTRAMUSCULAR | Status: AC
  Administered 2017-10-02: 30 mg via INTRAMUSCULAR
  Filled 2017-10-02: qty 1
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
## 2017-10-02 NOTE — ED Notes (Signed)
Patient verbalized understanding of discharge instructions and denies any further needs or questions at this time. VS stable. Patient ambulatory with steady gait.  

## 2017-10-02 NOTE — Discharge Instructions (Addendum)
Been evaluated today for right lower back pain.  Your history and physical exam is consistent with a musculoskeletal sprain and spasm.  I will discharge you home with naproxen and a muscle relaxer.  Please be careful with the muscle relaxer as it may make you sleepy.  Do not drive or operate heavy machinery on this medicine.  Will need to follow-up with your primary care provider in 2 days for reevaluation.  Please return to the ED with any of the following symptoms:  Contact a doctor if: You have pain that does not go away with rest or medicine. You have worsening pain that goes down into your legs or buttocks. You have pain that does not get better in one week. You have pain at night. You lose weight. You have a fever or chills. Get help right away if: You cannot control when you poop (bowel movement) or pee (urinate). Your arms or legs feel weak. Your arms or legs lose feeling (numbness). You feel sick to your stomach (nauseous) or throw up (vomit). You have belly (abdominal) pain. You feel like you may pass out (faint).

## 2017-10-02 NOTE — ED Triage Notes (Signed)
Patient complains of 2 weeks of intermittent right lower back pain that is worse with any ROM. Denies trauma. Denies dysuria. ambulatory

## 2018-09-05 ENCOUNTER — Telehealth: Payer: Self-pay | Admitting: Internal Medicine

## 2018-09-05 NOTE — Telephone Encounter (Signed)
Pt does not have insurance but is waiting to get insurance again and will call back to schedule

## 2018-09-16 ENCOUNTER — Emergency Department (HOSPITAL_COMMUNITY)
Admission: EM | Admit: 2018-09-16 | Discharge: 2018-09-16 | Disposition: A | Discharge status: Home / Self Care | Payer: BLUE CROSS/BLUE SHIELD | Attending: Emergency Medicine | Admitting: Emergency Medicine

## 2018-09-16 ENCOUNTER — Other Ambulatory Visit: Payer: Self-pay

## 2018-09-16 ENCOUNTER — Encounter (HOSPITAL_COMMUNITY): Payer: Self-pay

## 2018-09-16 DIAGNOSIS — Y929 Unspecified place or not applicable: Secondary | ICD-10-CM | POA: Insufficient documentation

## 2018-09-16 DIAGNOSIS — Z23 Encounter for immunization: Secondary | ICD-10-CM | POA: Insufficient documentation

## 2018-09-16 DIAGNOSIS — T300 Burn of unspecified body region, unspecified degree: Secondary | ICD-10-CM

## 2018-09-16 DIAGNOSIS — Y939 Activity, unspecified: Secondary | ICD-10-CM | POA: Insufficient documentation

## 2018-09-16 DIAGNOSIS — X010XXA Exposure to flames in uncontrolled fire, not in building or structure, initial encounter: Secondary | ICD-10-CM | POA: Insufficient documentation

## 2018-09-16 DIAGNOSIS — T23151A Burn of first degree of right palm, initial encounter: Secondary | ICD-10-CM | POA: Insufficient documentation

## 2018-09-16 DIAGNOSIS — Y999 Unspecified external cause status: Secondary | ICD-10-CM | POA: Insufficient documentation

## 2018-09-16 MED ORDER — BACITRACIN ZINC 500 UNIT/GM EX OINT: 1.0000 "application " | TOPICAL_OINTMENT | Freq: Two times a day (BID) | CUTANEOUS | 0 refills | Status: DC

## 2018-09-16 MED ORDER — TETANUS-DIPHTH-ACELL PERTUSSIS 5-2.5-18.5 LF-MCG/0.5 IM SUSP
0.5000 mL | Freq: Once | INTRAMUSCULAR | Status: AC
  Administered 2018-09-16: 0.5 mL via INTRAMUSCULAR
  Filled 2018-09-16: qty 0.5

## 2018-09-16 MED ORDER — BACITRACIN ZINC 500 UNIT/GM EX OINT
TOPICAL_OINTMENT | Freq: Two times a day (BID) | CUTANEOUS | Status: DC
  Administered 2018-09-16: 1 via TOPICAL
  Filled 2018-09-16: qty 0.9
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
## 2018-09-16 NOTE — ED Notes (Signed)
Patient is resting with her hand in ice water she brought from home to help the pain. Patient is stable and in a position of comfort.

## 2018-09-16 NOTE — Discharge Instructions (Signed)
Be sure to place bacitracin or Neosporin ointment to your hand with a thick covering and wrapped in a warm cloth over the next 3 days.  Follow-up for reevaluation in 48 hours.  If you develop blistering which occurs that surrounds her whole hand please seek reevaluation sooner.

## 2018-09-16 NOTE — ED Notes (Signed)
Applying bacitracin and a dry dressing to the patient's fingers to replace the water
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
## 2018-12-24 ENCOUNTER — Ambulatory Visit (HOSPITAL_COMMUNITY)
Admission: EM | Admit: 2018-12-24 | Discharge: 2018-12-24 | Disposition: A | Discharge status: Home / Self Care | Payer: HRSA Program | Attending: Family Medicine | Admitting: Family Medicine

## 2018-12-24 ENCOUNTER — Other Ambulatory Visit: Payer: Self-pay

## 2018-12-24 ENCOUNTER — Encounter (HOSPITAL_COMMUNITY): Payer: Self-pay

## 2018-12-24 DIAGNOSIS — Z20828 Contact with and (suspected) exposure to other viral communicable diseases: Secondary | ICD-10-CM | POA: Diagnosis not present

## 2018-12-24 DIAGNOSIS — J029 Acute pharyngitis, unspecified: Secondary | ICD-10-CM | POA: Diagnosis not present

## 2018-12-24 DIAGNOSIS — R519 Headache, unspecified: Secondary | ICD-10-CM

## 2018-12-24 DIAGNOSIS — Z20822 Contact with and (suspected) exposure to covid-19: Secondary | ICD-10-CM

## 2018-12-24 MED ORDER — CETIRIZINE-PSEUDOEPHEDRINE ER 5-120 MG PO TB12: 1.0000 | ORAL_TABLET | Freq: Every day | ORAL | 0 refills | Status: DC

## 2018-12-24 MED ORDER — BENZONATATE 100 MG PO CAPS: 100.0000 mg | ORAL_CAPSULE | Freq: Three times a day (TID) | ORAL | 0 refills | Status: DC

## 2018-12-24 NOTE — Discharge Instructions (Addendum)
COVID testing ordered.  It will take between 2-7 days for test results.  Someone will contact you regarding abnormal results.    In the meantime: You should remain isolated in your home for 10 days from symptom onset AND greater than 72 hours after symptoms resolution (absence of fever without the use of fever-reducing medication and improvement in respiratory symptoms), whichever is longer Get plenty of rest and push fluids Tessalon Perles prescribed for cough Zyrtec-D prescribed for nasal congestion, runny nose, and/or sore throat Use medications daily for symptom relief Use OTC medications like ibuprofen or tylenol as needed fever or pain Call or go to the ED if you have any new or worsening symptoms such as fever, worsening cough, shortness of breath, chest tightness, chest pain, turning blue, changes in mental status, etc..Marland Kitchen

## 2018-12-24 NOTE — ED Triage Notes (Signed)
Pt presents with c/o headache and sore throat that began last week, positive covid exposure lat week

## 2018-12-24 NOTE — ED Provider Notes (Signed)
[place]    CSN: [id number] Arrival date & time: 12/24/18  1004      History   Chief Complaint Chief Complaint  Patient presents with  . Sore Throat  . Headache    HPI Kathryn Miller is a 53 y.o. female.   Kathryn Miller 53 years old female presented to the urgent care for symptom of headache and sore throat for the past 2 weeks.  She was exposed to her daughter that tested positive for COVID-19 last week.   Denies sick exposure to , flu or strep.  Denies recent travel.  Denies aggravating or alleviating symptoms.  Denies previous COVID infection.   Denies fever, chills, fatigue, nasal congestion, rhinorrhea, sore throat, cough, SOB, wheezing, chest pain, nausea, vomiting, changes in bowel or bladder habits.    The history is provided by the patient. No language interpreter was used.  Sore Throat Associated symptoms include headaches.  Headache Associated symptoms: sore throat     Past Medical History:  Diagnosis Date  . Anemia   . Murmur, cardiac     Patient Active Problem List   Diagnosis Date Noted  . Morbid obesity ([place]) 04/08/2016  . Screen for colon cancer 04/08/2016  . HYPERTHYROIDISM 07/16/2009  . CHALAZION 07/15/2009  . SYSTOLIC MURMUR 81/01/7508  . METRORRHAGIA 10/07/2008  . BACTERIAL VAGINITIS 02/28/2008  . HOT FLASHES 02/28/2008  . Pain in limb 08/30/2007  . DENTAL CARIES 08/08/2007  . ALLERGIC RHINITIS 06/02/2007  . OTITIS MEDIA, LEFT 03/09/2007  . [place], [place] 03/09/2007  . PLANTAR FASCIITIS 05/21/2006  . ANEMIA-IRON DEFICIENCY 11/16/2005    Past Surgical History:  Procedure Laterality Date  . CESAREAN SECTION  [phone]  . TUBAL LIGATION  2004    OB History   No obstetric history on file.      Home Medications    Prior to Admission medications   Medication Sig Start Date End Date Taking? Authorizing Provider  bacitracin ointment Apply 1 application topically 2 (two) times daily. 09/16/18   Henderly, Britni  A, PA-C  benzonatate (TESSALON) 100 MG capsule Take 1 capsule (100 mg total) by mouth every 8 (eight) hours. 12/24/18   Rosalina Dingwall, Darrelyn Hillock, FNP  cetirizine-pseudoephedrine (ZYRTEC-D) 5-120 MG tablet Take 1 tablet by mouth daily. 12/24/18   Divon Krabill, Darrelyn Hillock, FNP  ibuprofen (ADVIL,MOTRIN) 200 MG tablet Take 1,000 mg by mouth every 6 (six) hours as needed for mild pain or moderate pain.     [provider]  methocarbamol (ROBAXIN) 500 MG tablet Take 1 tablet (500 mg total) by mouth 2 (two) times daily. 10/02/17   Henderly, Britni A, PA-C  naproxen (NAPROSYN) 500 MG tablet Take 1 tablet (500 mg total) by mouth 2 (two) times daily. 10/02/17   Henderly, Britni A, PA-C  predniSONE (STERAPRED UNI-PAK 21 TAB) 10 MG (21) TBPK tablet Take 6 tabs by mouth daily  for 2 days, then 5 tabs for 2 days, then 4 tabs for 2 days, then 3 tabs for 2 days, 2 tabs for 2 days, then 1 tab by mouth daily for 2 days 07/31/17   Lorayne Bender, PA-C    Family History Family History  Problem Relation Age of Onset  . Breast cancer Mother   . Diabetes Mother   . Hypertension Mother     Social History Social History   Tobacco Use  . Smoking status: Never Smoker  . Smokeless tobacco: Never Used  Substance Use Topics  . Alcohol use: No  . Drug  use: No     Allergies   Codeine   Review of Systems Review of Systems  Constitutional: Negative.   HENT: Positive for sore throat.   Respiratory: Negative.   Cardiovascular: Negative.   Gastrointestinal: Negative.   Neurological: Positive for headaches.  ROS: All other are negative   Physical Exam Triage Vital Signs ED Triage Vitals  Enc Vitals Group     BP 12/24/18 1033 108/85     Pulse Rate 12/24/18 1033 77     Resp 12/24/18 1033 18     Temp 12/24/18 1033 99.5 F (37.5 C)     Temp src --      SpO2 12/24/18 1033 95 %     Weight --      Height --      Head Circumference --      Peak Flow --      Pain Score 12/24/18 1031 6     Pain Loc --      Pain  Edu? --      Excl. in GC? --    No data found.  Updated Vital Signs BP 108/85   Pulse 77   Temp 99.5 F (37.5 C)   Resp 18   SpO2 95%   Visual Acuity Right Eye Distance:   Left Eye Distance:   Bilateral Distance:    Right Eye Near:   Left Eye Near:    Bilateral Near:     Physical Exam Vitals signs and nursing note reviewed.  Constitutional:      General: She is not in acute distress.    Appearance: She is well-developed. She is obese. She is not ill-appearing or toxic-appearing.  HENT:     Right Ear: Tympanic membrane and ear canal normal.     Left Ear: Tympanic membrane and ear canal normal.     Nose: Nose normal.     Mouth/Throat:     Mouth: Mucous membranes are moist.     Pharynx: Oropharynx is clear.     Tonsils: No tonsillar exudate or tonsillar abscesses. 2+ on the right. 2+ on the left.  Cardiovascular:     Rate and Rhythm: Normal rate and regular rhythm.     Pulses: Normal pulses.     Heart sounds: Normal heart sounds. No murmur.  Pulmonary:     Effort: Pulmonary effort is normal. No respiratory distress.     Breath sounds: Normal breath sounds.  Chest:     Chest wall: No tenderness.  Abdominal:     General: Bowel sounds are normal. There is no distension.     Palpations: Abdomen is soft. There is no mass.     Tenderness: There is no abdominal tenderness. There is no guarding.  Skin:    Capillary Refill: Capillary refill takes less than 2 seconds.  Neurological:     Mental Status: She is alert and oriented to person, place, and time.      UC Treatments / Results  Labs (all labs ordered are listed, but only abnormal results are displayed) Labs Reviewed  NOVEL CORONAVIRUS, NAA (HOSP ORDER, SEND-OUT TO REF LAB; TAT 18-24 HRS)    EKG   Radiology No results found.  Procedures Procedures (including critical care time)  Medications Ordered in UC Medications - No data to display  Initial Impression / Assessment and Plan / UC Course  I have  reviewed the triage vital signs and the nursing notes.  Pertinent labs & imaging results that were available during my care  of the patient were reviewed by me and considered in my medical decision making (see chart for details).     Patient stable for discharge.  Benign physical exam.  Will prescribe Tessalon Perles and Zyrtec to manage symptoms. will call patient when COVID result is abnormal  Final Clinical Impressions(s) / UC Diagnoses   Final diagnoses:  Suspected COVID-19 virus infection     Discharge Instructions     COVID testing ordered.  It will take between 2-7 days for test results.  Someone will contact you regarding abnormal results.    In the meantime: You should remain isolated in your home for 10 days from symptom onset AND greater than 72 hours after symptoms resolution (absence of fever without the use of fever-reducing medication and improvement in respiratory symptoms), whichever is longer Get plenty of rest and push fluids Tessalon Perles prescribed for cough Zyrtec-D prescribed for nasal congestion, runny nose, and/or sore throat Use medications daily for symptom relief Use OTC medications like ibuprofen or tylenol as needed fever or pain Call or go to the ED if you have any new or worsening symptoms such as fever, worsening cough, shortness of breath, chest tightness, chest pain, turning blue, changes in mental status, etc...    ED Prescriptions    Medication Sig Dispense Auth. Provider   cetirizine-pseudoephedrine (ZYRTEC-D) 5-120 MG tablet Take 1 tablet by mouth daily. 30 tablet Milessa Hogan, Zachery Dakins, FNP   benzonatate (TESSALON) 100 MG capsule Take 1 capsule (100 mg total) by mouth every 8 (eight) hours. 21 capsule Deserai Cansler, Zachery Dakins, FNP     PDMP not reviewed this encounter.   Durward Parcel, FNP 12/24/18 1108

## 2018-12-25 LAB — NOVEL CORONAVIRUS, NAA (HOSP ORDER, SEND-OUT TO REF LAB; TAT 18-24 HRS): SARS-CoV-2, NAA: NOT DETECTED

## 2018-12-26 ENCOUNTER — Telehealth: Payer: Self-pay | Admitting: Emergency Medicine

## 2018-12-26 NOTE — Telephone Encounter (Signed)
Pt granddaughter is in the house and tested positive for covid. Pt instructed that she still needs to quarantine for 14 days from last exposure. Pt verbalized understanding, all questions answered.

## 2018-12-29 ENCOUNTER — Emergency Department (HOSPITAL_COMMUNITY)
Admission: EM | Admit: 2018-12-29 | Discharge: 2018-12-29 | Disposition: A | Discharge status: Home / Self Care | Payer: Self-pay | Attending: Emergency Medicine | Admitting: Emergency Medicine

## 2018-12-29 ENCOUNTER — Encounter (HOSPITAL_COMMUNITY): Payer: Self-pay | Admitting: *Deleted

## 2018-12-29 ENCOUNTER — Other Ambulatory Visit: Payer: Self-pay

## 2018-12-29 DIAGNOSIS — M545 Low back pain, unspecified: Secondary | ICD-10-CM

## 2018-12-29 DIAGNOSIS — G8929 Other chronic pain: Secondary | ICD-10-CM | POA: Insufficient documentation

## 2018-12-29 DIAGNOSIS — Z79899 Other long term (current) drug therapy: Secondary | ICD-10-CM | POA: Insufficient documentation

## 2018-12-29 DIAGNOSIS — R319 Hematuria, unspecified: Secondary | ICD-10-CM | POA: Insufficient documentation

## 2018-12-29 DIAGNOSIS — R1031 Right lower quadrant pain: Secondary | ICD-10-CM | POA: Insufficient documentation

## 2018-12-29 LAB — COMPREHENSIVE METABOLIC PANEL
ALT: 14 U/L (ref 0–44)
AST: 15 U/L (ref 15–41)
Albumin: 3.6 g/dL (ref 3.5–5.0)
Alkaline Phosphatase: 73 U/L (ref 38–126)
Anion gap: 10 (ref 5–15)
BUN: 13 mg/dL (ref 6–20)
CO2: 23 mmol/L (ref 22–32)
Calcium: 9.1 mg/dL (ref 8.9–10.3)
Chloride: 105 mmol/L (ref 98–111)
Creatinine, Ser: 0.56 mg/dL (ref 0.44–1.00)
GFR calc Af Amer: >60 mL/min (ref >60)
GFR calc non Af Amer: >60 mL/min (ref >60)
Glucose, Bld: 102 mg/dL — ABNORMAL HIGH (ref 70–99)
Potassium: 3.6 mmol/L (ref 3.5–5.1)
Sodium: 138 mmol/L (ref 135–145)
Total Bilirubin: 0.7 mg/dL (ref 0.3–1.2)
Total Protein: 7.2 g/dL (ref 6.5–8.1)

## 2018-12-29 LAB — URINALYSIS, ROUTINE W REFLEX MICROSCOPIC
Bilirubin Urine: NEGATIVE
Glucose, UA: NEGATIVE mg/dL
Ketones, ur: NEGATIVE mg/dL
Leukocytes,Ua: NEGATIVE
Nitrite: NEGATIVE
Protein, ur: NEGATIVE mg/dL
Specific Gravity, Urine: 1.024 (ref 1.005–1.030)
pH: 5 (ref 5.0–8.0)

## 2018-12-29 LAB — CBC
HCT: 35.4 % — ABNORMAL LOW (ref 36.0–46.0)
Hemoglobin: 11.9 g/dL — ABNORMAL LOW (ref 12.0–15.0)
MCH: 26.4 pg (ref 26.0–34.0)
MCHC: 33.6 g/dL (ref 30.0–36.0)
MCV: 78.7 fL — ABNORMAL LOW (ref 80.0–100.0)
Platelets: 280 10*3/uL (ref 150–400)
RBC: 4.5 MIL/uL (ref 3.87–5.11)
RDW: 15.8 % — ABNORMAL HIGH (ref 11.5–15.5)
WBC: 9 10*3/uL (ref 4.0–10.5)
nRBC: 0 % (ref 0.0–0.2)

## 2018-12-29 LAB — I-STAT BETA HCG BLOOD, ED (MC, WL, AP ONLY): I-stat hCG, quantitative: <5 m[IU]/mL (ref <5)

## 2018-12-29 LAB — LIPASE, BLOOD: Lipase: 18 U/L (ref 11–51)

## 2018-12-29 MED ORDER — METHOCARBAMOL 500 MG PO TABS: 500.0000 mg | ORAL_TABLET | Freq: Three times a day (TID) | ORAL | 0 refills | Status: DC | PRN

## 2018-12-29 MED ORDER — PREDNISONE 20 MG PO TABS: 40.0000 mg | ORAL_TABLET | Freq: Every day | ORAL | 0 refills | Status: DC

## 2018-12-29 MED ORDER — SODIUM CHLORIDE 0.9% FLUSH: 3.0000 mL | Freq: Once | INTRAVENOUS | Status: DC

## 2018-12-29 NOTE — ED Notes (Signed)
Aware she needs to get a urine

## 2018-12-29 NOTE — ED Provider Notes (Signed)
[facility] Provider [facility]   CSN: [id number] Arrival date & time: 12/29/18  [id number]     History Chief Complaint  Patient presents with  . Abdominal Pain  . Back Pain    Kathryn Miller is a 53 y.o. female.  HPI    Patient presents with lower abdominal and back pain.  Has been going for weeks to months.  Comes and goes.  Last couple days at a time.  Feels of his back pain becomes around the abdomen.  Worse with movement.  Worse with certain positions.  States she is felt constipated at times and took a laxative which resolved that but she was not having any pain at the time.  No vaginal bleeding or discharge.  No fevers or chills.  No diarrhea. Past Medical History:  Diagnosis Date  . Anemia   . Murmur, cardiac     Patient Active Problem List   Diagnosis Date Noted  . Morbid obesity (HCC) 04/08/2016  . Screen for colon cancer 04/08/2016  . HYPERTHYROIDISM 07/16/2009  . CHALAZION 07/15/2009  . SYSTOLIC MURMUR 07/15/2009  . METRORRHAGIA 10/07/2008  . BACTERIAL VAGINITIS 02/28/2008  . HOT FLASHES 02/28/2008  . Pain in limb 08/30/2007  . DENTAL CARIES 08/08/2007  . ALLERGIC RHINITIS 06/02/2007  . OTITIS MEDIA, LEFT 03/09/2007  . CARBUNCLE, ARM 03/09/2007  . PLANTAR FASCIITIS 05/21/2006  . ANEMIA-IRON DEFICIENCY 11/16/2005    Past Surgical History:  Procedure Laterality Date  . CESAREAN SECTION  [phone],[id number],[phone]  . TUBAL LIGATION  2004     OB History   No obstetric history on file.     Family History  Problem Relation Age of Onset  . Breast cancer Mother   . Diabetes Mother   . Hypertension Mother     Social History   Tobacco Use  . Smoking status: Never Smoker  . Smokeless tobacco: Never Used  Substance Use Topics  . Alcohol use: No  . Drug use: No    Home Medications Prior to Admission medications   Medication Sig Start Date End Date Taking? Authorizing Provider  bacitracin ointment Apply 1 application  topically 2 (two) times daily. 09/16/18   Henderly, Britni A, PA-C  benzonatate (TESSALON) 100 MG capsule Take 1 capsule (100 mg total) by mouth every 8 (eight) hours. 12/24/18   Avegno, Zachery DakinsKomlanvi S, FNP  cetirizine-pseudoephedrine (ZYRTEC-D) 5-120 MG tablet Take 1 tablet by mouth daily. 12/24/18   Avegno, Zachery DakinsKomlanvi S, FNP  ibuprofen (ADVIL,MOTRIN) 200 MG tablet Take 1,000 mg by mouth every 6 (six) hours as needed for mild pain or moderate pain.     [provider]  methocarbamol (ROBAXIN) 500 MG tablet Take 1 tablet (500 mg total) by mouth every 8 (eight) hours as needed for muscle spasms. 12/29/18   Benjiman CorePickering, Berdell Nevitt, MD  naproxen (NAPROSYN) 500 MG tablet Take 1 tablet (500 mg total) by mouth 2 (two) times daily. 10/02/17   Henderly, Britni A, PA-C  predniSONE (DELTASONE) 20 MG tablet Take 2 tablets (40 mg total) by mouth daily. 12/29/18   Benjiman CorePickering, Kacee Sukhu, MD    Allergies    Codeine  Review of Systems   Review of Systems  Constitutional: Negative for diaphoresis.  HENT: Negative for congestion.   Respiratory: Negative for shortness of breath.   Cardiovascular: Negative for chest pain.  Gastrointestinal: Positive for abdominal pain.  Genitourinary: Negative for urgency.  Musculoskeletal: Positive for back pain.  Skin: Negative for rash.  Neurological: Negative for weakness.  Psychiatric/Behavioral:  Negative for confusion.    Physical Exam Updated Vital Signs BP 124/68 (BP Location: Left Arm)   Pulse 87   Temp 98 F (36.7 C) (Oral)   Resp 16   Ht 5\' 2"  (1.575 m)   Wt 98.4 kg   SpO2 99%   BMI 39.69 kg/m   Physical Exam Vitals and nursing note reviewed.  HENT:     Head: Normocephalic.  Eyes:     Extraocular Movements: Extraocular movements intact.  Cardiovascular:     Rate and Rhythm: Regular rhythm.  Pulmonary:     Breath sounds: Normal breath sounds.  Abdominal:     Hernia: No hernia is present.     Comments: Mild right lower quadrant tenderness without rebound  or guarding. Tenderness right lower back.  No deformity.  No rash.  Skin:    General: Skin is warm.     Capillary Refill: Capillary refill takes less than 2 seconds.  Neurological:     Mental Status: She is alert.     Comments: No pain with straight leg raise.     ED Results / Procedures / Treatments   Labs (all labs ordered are listed, but only abnormal results are displayed) Labs Reviewed  COMPREHENSIVE METABOLIC PANEL - Abnormal; Notable for the following components:      Result Value   Glucose, Bld 102 (*)    All other components within normal limits  CBC - Abnormal; Notable for the following components:   Hemoglobin 11.9 (*)    HCT 35.4 (*)    MCV 78.7 (*)    RDW 15.8 (*)    All other components within normal limits  URINALYSIS, ROUTINE W REFLEX MICROSCOPIC - Abnormal; Notable for the following components:   Hgb urine dipstick SMALL (*)    Bacteria, UA RARE (*)    All other components within normal limits  LIPASE, BLOOD  I-STAT BETA HCG BLOOD, ED ([facility], [facility], [facility] ONLY)    EKG None  Radiology No results found.  Procedures Procedures (including critical care time)  Medications Ordered in ED Medications  sodium chloride flush (NS) 0.9 % injection 3 mL (has no administration in time range)    ED Course  I have reviewed the triage vital signs and the nursing notes.  Pertinent labs & imaging results that were available during my care of the patient were reviewed by me and considered in my medical decision making (see chart for details).    MDM Rules/Calculators/A&P     CHA2DS2/VAS Stroke Risk Points      N/A >= 2 Points: High Risk  1 - 1.99 Points: Medium Risk  0 Points: Low Risk    A final score could not be computed because of missing components.: Last  Change: N/A     This score determines the patient's risk of having a stroke if the  patient has atrial fibrillation.      This score is not applicable to this patient. Components are not  calculated.                    Patient with low back pain that does go to abdomen.  Lab work reassuring but has some mild hematuria.  Has had prior not worked up.  Discussed with CT scan now for further evaluation versus symptomatic treatment as musculoskeletal pain.  Would do more of an outpatient work-up.  Aware of risks.  Given follow-up with urology for the hematuria.  Discharge home.  Will treat with steroids  and muscle relaxers. Final Clinical Impression(s) / ED Diagnoses Final diagnoses:  Chronic right-sided low back pain without sciatica  Hematuria, unspecified type    Rx / DC Orders ED Discharge Orders         Ordered    predniSONE (DELTASONE) 20 MG tablet  Daily     12/29/18 0949    methocarbamol (ROBAXIN) 500 MG tablet  Every 8 hours PRN     12/29/18 0949           Davonna Belling, MD 12/29/18 [phone]

## 2018-12-29 NOTE — ED Triage Notes (Signed)
C/o lower abd. Pain and lower back pain  Onset several days ago states pian was worse last pm

## 2018-12-29 NOTE — ED Notes (Signed)
Pt was given  A urine cup. Pt is aware that UA is needed.

## 2019-01-02 ENCOUNTER — Other Ambulatory Visit: Payer: Self-pay

## 2019-01-02 ENCOUNTER — Ambulatory Visit (HOSPITAL_COMMUNITY)
Admission: EM | Admit: 2019-01-02 | Discharge: 2019-01-02 | Disposition: A | Discharge status: Home / Self Care | Payer: HRSA Program | Attending: Family Medicine | Admitting: Family Medicine

## 2019-01-02 ENCOUNTER — Encounter (HOSPITAL_COMMUNITY): Payer: Self-pay

## 2019-01-02 DIAGNOSIS — Z20822 Contact with and (suspected) exposure to covid-19: Secondary | ICD-10-CM

## 2019-01-02 DIAGNOSIS — Z20828 Contact with and (suspected) exposure to other viral communicable diseases: Secondary | ICD-10-CM | POA: Diagnosis not present

## 2019-01-02 NOTE — ED Triage Notes (Signed)
Pt here for covid testing after her daughter tested positive on 12/24/2018, pt denies any symptoms of illness.

## 2019-01-02 NOTE — Discharge Instructions (Signed)
If your Covid-19 test is positive, you will receive a phone call from [facility] regarding your results. Negative test results are not called. Both positive and negative results area always visible on MyChart. If you do not have a MyChart account, sign up instructions are in your discharge papers.  

## 2019-01-02 NOTE — ED Provider Notes (Signed)
[facility]   [id number] 01/02/19 Arrival Time: 6789  ASSESSMENT & PLAN:  1. Exposure to COVID-19 virus      COVID-19 testing sent. To self-quarantine until results are available. If requested, work note provided.   Follow-up Information    [facility].   Specialty: Urgent Care Why: As needed. Contact information: [place] [phone]          Reviewed expectations re: course of current medical issues. Questions answered. Outlined signs and symptoms indicating need for more acute intervention. Patient verbalized understanding. After Visit Summary given.   SUBJECTIVE: History from: patient. Kathryn Miller is a 53 y.o. female who requests COVID-19 testing. Known COVID-19 contact: family member. Recent travel: none. Denies: runny nose, congestion, fever, cough, sore throat, difficulty breathing and headache. Normal PO intake without n/v/d.  ROS: As per HPI.   OBJECTIVE:  Vitals:   01/02/19 1023  BP: 121/76  Pulse: 68  Resp: 15  Temp: 98.1 F (36.7 C)  TempSrc: Oral  SpO2: 99%    General appearance: alert; no distress Eyes: PERRLA; EOMI; conjunctiva normal HENT: [place]; AT; nasal mucosa normal; oral mucosa normal Neck: supple  Lungs: speaks full sentences without difficulty; unlabored Heart: regular rate and rhythm Abdomen: soft, non-tender Extremities: no edema Skin: warm and dry Neurologic: normal gait Psychological: alert and cooperative; normal mood and affect  Labs:  Labs Reviewed  NOVEL CORONAVIRUS, NAA (HOSP ORDER, SEND-OUT TO REF LAB; TAT 18-24 HRS)      Allergies  Allergen Reactions  . Codeine Nausea And Vomiting    Past Medical History:  Diagnosis Date  . Anemia   . Murmur, cardiac    Social History   Socioeconomic History  . Marital status: Married    Spouse name: Not on file  . Number of children: Not on file  . Years of education: Not  on file  . Highest education level: Not on file  Occupational History  . Occupation: unemployed  Tobacco Use  . Smoking status: Never Smoker  . Smokeless tobacco: Never Used  Substance and Sexual Activity  . Alcohol use: No  . Drug use: No  . Sexual activity: Not on file  Other Topics Concern  . Not on file  Social History Narrative   Domestic partner since 1982 and father of children but did not marry.   Social Determinants of Health   Financial Resource Strain:   . Difficulty of Paying Living Expenses: Not on file  Food Insecurity:   . Worried About Charity fundraiser in the Last Year: Not on file  . Ran Out of Food in the Last Year: Not on file  Transportation Needs:   . Lack of Transportation (Medical): Not on file  . Lack of Transportation (Non-Medical): Not on file  Physical Activity:   . Days of Exercise per Week: Not on file  . Minutes of Exercise per Session: Not on file  Stress:   . Feeling of Stress : Not on file  Social Connections:   . Frequency of Communication with Friends and Family: Not on file  . Frequency of Social Gatherings with Friends and Family: Not on file  . Attends Religious Services: Not on file  . Active Member of Clubs or Organizations: Not on file  . Attends Archivist Meetings: Not on file  . Marital Status: Not on file  Intimate Partner Violence:   . Fear of Current or  Ex-Partner: Not on file  . Emotionally Abused: Not on file  . Physically Abused: Not on file  . Sexually Abused: Not on file   Family History  Problem Relation Age of Onset  . Breast cancer Mother   . Diabetes Mother   . Hypertension Mother    Past Surgical History:  Procedure Laterality Date  . CESAREAN SECTION  [phone]  . TUBAL LIGATION  2004     Mardella Layman, MD 01/02/19 1044

## 2019-01-04 ENCOUNTER — Telehealth: Payer: Self-pay

## 2019-01-04 LAB — NOVEL CORONAVIRUS, NAA (HOSP ORDER, SEND-OUT TO REF LAB; TAT 18-24 HRS): SARS-CoV-2, NAA: NOT DETECTED

## 2019-01-04 NOTE — Telephone Encounter (Signed)
Caller given negative result from 01/02/2019.

## 2019-01-09 NOTE — Progress Notes (Signed)
Patient ID: DELBERTA FOLTS, female   DOB: 11/14/1965, 53 y.o.   MRN: [id number] Virtual Visit via Telephone Note  I connected with Kathryn Miller on 01/10/19 at  9:10 AM EST by telephone and verified that I am speaking with the correct person using two identifiers.   I discussed the limitations, risks, security and privacy concerns of performing an evaluation and management service by telephone and the availability of in person appointments. I also discussed with the patient that there may be a patient responsible charge related to this service. The patient expressed understanding and agreed to proceed.  Patient location:  home My Location:  [place] office Persons on the call:  Me and the patient   History of Present Illness: Having pain in back and stomach.  Some pain with urination but UA did not show infection.  All of her symptoms wax and wane.  She is also having intermittent constipation.  Muscle relaxer helped some with pain.  No fever.  No N/V/D.  No loss of taste or smell.   After being seen in ED 01/02/2019 subsequent to being exposed to a family member with Covid 51.  She tested negative in the ED.    From ED note: Kathryn Miller is a 53 y.o. female who requests COVID-19 testing. Known COVID-19 contact: family member. Recent travel: none. Denies: runny nose, congestion, fever, cough, sore throat, difficulty breathing and headache. Normal PO intake without n/v/d.    Observations/Objective:  NAD.  A&Ox3   Assessment and Plan:   1. Back pain, unspecified back location, unspecified back pain laterality, unspecified chronicity - naproxen (NAPROSYN) 500 MG tablet; Take 1 tablet (500 mg total) by mouth 2 (two) times daily.  Dispense: 60 tablet; Refill: 1 - methocarbamol (ROBAXIN) 500 MG tablet; Take 2 tablets (1,000 mg total) by mouth every 8 (eight) hours as needed for muscle spasms.  Dispense: 90 tablet; Refill: 0  2. Constipation, unspecified constipation type - polyethylene glycol  powder (GLYCOLAX/MIRALAX) 17 GM/SCOOP powder; Take 17 g by mouth 2 (two) times daily as needed.  Dispense: 3350 g; Refill: 1  3. Encounter for examination following treatment at hospital Symptoms intermittent/wax and wane.  Reviewed labs which were reassuring and Covid test negative.  To ED if worsens.  Get financial packet.  Follow Up Instructions: Assign PCP 3-4 weeks   I discussed the assessment and treatment plan with the patient. The patient was provided an opportunity to ask questions and all were answered. The patient agreed with the plan and demonstrated an understanding of the instructions.   The patient was advised to call back or seek an in-person evaluation if the symptoms worsen or if the condition fails to improve as anticipated.  I provided 12 minutes of non-face-to-face time during this encounter.   Freeman Caldron, PA-C

## 2019-01-10 ENCOUNTER — Other Ambulatory Visit: Payer: Self-pay

## 2019-01-10 ENCOUNTER — Ambulatory Visit: Payer: Self-pay | Attending: Family Medicine | Admitting: Physician Assistant

## 2019-01-10 DIAGNOSIS — M5489 Other dorsalgia: Secondary | ICD-10-CM

## 2019-01-10 DIAGNOSIS — R109 Unspecified abdominal pain: Secondary | ICD-10-CM

## 2019-01-10 DIAGNOSIS — Z09 Encounter for follow-up examination after completed treatment for conditions other than malignant neoplasm: Secondary | ICD-10-CM

## 2019-01-10 DIAGNOSIS — M549 Dorsalgia, unspecified: Secondary | ICD-10-CM

## 2019-01-10 DIAGNOSIS — K5900 Constipation, unspecified: Secondary | ICD-10-CM

## 2019-01-10 MED ORDER — POLYETHYLENE GLYCOL 3350 17 GM/SCOOP PO POWD: 17.0000 g | Freq: Two times a day (BID) | ORAL | 1 refills | Status: DC | PRN

## 2019-01-10 MED ORDER — NAPROXEN 500 MG PO TABS: 500.0000 mg | ORAL_TABLET | Freq: Two times a day (BID) | ORAL | 1 refills | Status: DC

## 2019-01-10 MED ORDER — METHOCARBAMOL 500 MG PO TABS: 1000.0000 mg | ORAL_TABLET | Freq: Three times a day (TID) | ORAL | 0 refills | Status: DC | PRN

## 2019-01-10 MED FILL — POLYETHYLENE GLYCOL 3350 PO: 17 | 7 days supply | Qty: 238 | Fill #0

## 2019-01-10 MED FILL — NAPROXEN 500 MG TABLET: 500 | 30 days supply | Qty: 60 | Fill #0

## 2019-01-10 MED FILL — METHOCARBAMOL 500 MG TABS: 500 | 15 days supply | Qty: 90 | Fill #0

## 2019-02-07 ENCOUNTER — Other Ambulatory Visit: Payer: Self-pay

## 2019-02-07 ENCOUNTER — Encounter: Payer: Self-pay | Admitting: Family Medicine

## 2019-02-07 ENCOUNTER — Ambulatory Visit: Payer: Self-pay | Attending: Family Medicine | Admitting: Family Medicine

## 2019-02-07 DIAGNOSIS — M545 Low back pain, unspecified: Secondary | ICD-10-CM

## 2019-02-07 DIAGNOSIS — R319 Hematuria, unspecified: Secondary | ICD-10-CM

## 2019-02-07 DIAGNOSIS — Z1231 Encounter for screening mammogram for malignant neoplasm of breast: Secondary | ICD-10-CM

## 2019-02-07 DIAGNOSIS — Z1211 Encounter for screening for malignant neoplasm of colon: Secondary | ICD-10-CM

## 2019-02-07 DIAGNOSIS — K5900 Constipation, unspecified: Secondary | ICD-10-CM

## 2019-02-07 DIAGNOSIS — G8929 Other chronic pain: Secondary | ICD-10-CM

## 2019-02-07 DIAGNOSIS — D649 Anemia, unspecified: Secondary | ICD-10-CM

## 2019-02-07 NOTE — Progress Notes (Signed)
Virtual Visit via Telephone Note  I connected with Kathryn Miller on 02/07/19 at  9:30 AM EST by telephone and verified that I am speaking with the correct person using two identifiers.   I discussed the limitations, risks, security and privacy concerns of performing an evaluation and management service by telephone and the availability of in person appointments. I also discussed with the patient that there may be a patient responsible charge related to this service. The patient expressed understanding and agreed to proceed.  Patient Location: Home Provider Location: [facility] Office Others participating in call: none   History of Present Illness:        54 year old female status post virtual visit on January 10, 2019 with another provider secondary to complaint of back and stomach pain. She was prescribed naproxen and Robaxin for back pain and MiraLAX for constipation.          At today's visit she has complaint of continued low back pain. Pain is more so on the right side of the back above the buttock and when she lies down at night, the pain is worse and she has to grab onto the side of the mattress to pull her self over. She otherwise has to lie in one place. Medication decreases the pain but does not take it away. Current pain is a 7-8 as she just recently got out of bed. Pain is a pulling sensation and fades in and out. No urinary urgency, frequency or dysuria. She did have these symptoms when she went to the ED in December. She has had issues off and on with constipation. She has been taking Ducolax pills but then they seemed to stop working and she is taking two fiber gummies. She did not like the gritty taste of the Miralax so she stopped taking the medication. She denies any blood in the stool or black stools.  No current abdominal pain, no nausea or vomiting.  She reports no prior screening colonoscopy.          She denies any current issues with urinary frequency, dysuria or blood in the  urine.  She did have abnormal urinalysis at her last visit.  She denies any current issues with chest pain or palpitations, no shortness of breath or cough, no fever or chills and no headaches or dizziness.   Past Medical History:  Diagnosis Date  . Anemia   . Murmur, cardiac     Past Surgical History:  Procedure Laterality Date  . CESAREAN SECTION  [phone]  . TUBAL LIGATION  2004    Family History  Problem Relation Age of Onset  . Breast cancer Mother   . Diabetes Mother   . Hypertension Mother     Social History   Tobacco Use  . Smoking status: Never Smoker  . Smokeless tobacco: Never Used  Substance Use Topics  . Alcohol use: No  . Drug use: No     Allergies  Allergen Reactions  . Codeine Nausea And Vomiting       Observations/Objective: No vital signs or physical exam conducted as visit was done via telephone  Assessment and Plan: 1. Chronic midline low back pain without sciatica Patient with continued issues of chronic midline low back pain and patient has been asked to have an x-ray done of the lumbar spine to look for any abnormalities such as degenerative disc disc disease, spondylolysis , scoliosis or other abnormalities which may be contributing to her back pain. - DG Lumbar Spine Complete;  Future  2. Constipation, unspecified constipation type Patient with complaints of constipation with recurrent abdominal pain.  She states that her constipation issues have somewhat improved with the use of fiber Gummies but that she did not like the texture of MiraLAX which she was previously prescribed.  She was asked to come into the office to have TSH to see if hypothyroidism may be playing a role in her constipation issues. - TSH; Future  3. Screening for colon cancer Health maintenance discussed with the patient and she agrees to be referred for colonoscopy as a screening test for colon cancer  4. Anemia, unspecified type Patient reports that she has  had past issues with anemia and on her last CBC which was done 12/29/2018, hemoglobin was just below normal at 11.9 with hematocrit of 35.4 and MCV of 78.7 suggestive of possible iron deficiency anemia.  Patient has been asked to come into the office to have CBC in follow-up of anemia. - CBC; Future  5. Hematuria, unspecified type Patient with presence of hemoglobin on urine dipstick on urinalysis done 12/29/2018.  Patient has been asked to return to clinic for repeat urinalysis and urine culture as lab visit. - Urinalysis; Future - Urine Culture; Future  6. Encounter for screening mammogram for malignant neoplasm of breast Health maintenance discussed and patient agrees to be scheduled for screening mammogram - MM Digital Screening; Future  Follow Up Instructions:Return in about 6 weeks (around 03/21/2019) for back pain, constipation, hematuria; lab visit in 2-3 weeks.    I discussed the assessment and treatment plan with the patient. The patient was provided an opportunity to ask questions and all were answered. The patient agreed with the plan and demonstrated an understanding of the instructions.   The patient was advised to call back or seek an in-person evaluation if the symptoms worsen or if the condition fails to improve as anticipated.  I provided 25 minutes of non-face-to-face time during this encounter.   Cain Saupe, MD

## 2019-02-16 ENCOUNTER — Other Ambulatory Visit: Payer: Self-pay | Admitting: Physician Assistant

## 2019-02-16 DIAGNOSIS — M549 Dorsalgia, unspecified: Secondary | ICD-10-CM

## 2019-02-16 MED FILL — METHOCARBAMOL 500 MG TABS: 500 | 15 days supply | Qty: 90 | Fill #0

## 2019-02-16 MED FILL — NAPROXEN 500 MG TABLET: 500 | 30 days supply | Qty: 60 | Fill #0

## 2019-02-21 ENCOUNTER — Other Ambulatory Visit: Payer: Self-pay

## 2019-02-21 ENCOUNTER — Ambulatory Visit: Payer: Self-pay | Attending: Family Medicine

## 2019-02-21 DIAGNOSIS — R319 Hematuria, unspecified: Secondary | ICD-10-CM

## 2019-02-21 DIAGNOSIS — D649 Anemia, unspecified: Secondary | ICD-10-CM

## 2019-02-21 DIAGNOSIS — K5900 Constipation, unspecified: Secondary | ICD-10-CM

## 2019-02-22 LAB — CBC
Hematocrit: 36.6 % (ref 34.0–46.6)
Hemoglobin: 12.2 g/dL (ref 11.1–15.9)
MCH: 26.9 pg (ref 26.6–33.0)
MCHC: 33.3 g/dL (ref 31.5–35.7)
MCV: 81 fL (ref 79–97)
Platelets: 269 x10E3/uL (ref 150–450)
RBC: 4.54 x10E6/uL (ref 3.77–5.28)
RDW: 15.6 % — ABNORMAL HIGH (ref 11.7–15.4)
WBC: 7.1 x10E3/uL (ref 3.4–10.8)

## 2019-02-22 LAB — URINALYSIS
Bilirubin, UA: NEGATIVE
Glucose, UA: NEGATIVE
Ketones, UA: NEGATIVE
Leukocytes,UA: NEGATIVE
Nitrite, UA: NEGATIVE
Protein,UA: NEGATIVE
RBC, UA: NEGATIVE
Urobilinogen, Ur: 0.2 mg/dL (ref 0.2–1.0)
pH, UA: 5.5 (ref 5.0–7.5)

## 2019-02-22 LAB — TSH: TSH: 0.796 u[IU]/mL (ref 0.450–4.500)

## 2019-02-23 LAB — URINE CULTURE: Organism ID, Bacteria: NO GROWTH

## 2019-02-28 ENCOUNTER — Telehealth (INDEPENDENT_AMBULATORY_CARE_PROVIDER_SITE_OTHER): Payer: Self-pay

## 2019-02-28 NOTE — Telephone Encounter (Signed)
-----   Message from Cain Saupe, MD sent at 02/27/2019  2:38 PM EST ----- No growth of bacteria on urine culture.  Normal complete blood count.  No red blood cells were seen on urinalysis.  Normal thyroid blood test.

## 2019-02-28 NOTE — Telephone Encounter (Signed)
Patient verified her date of birth. She was then informed that there was No growth of bacteria on urine culture. Normal complete blood count. No red blood cells were seen on urinalysis. Normal thyroid blood test. She verbalized understanding of results. Maryjean Morn, CMA

## 2019-03-05 ENCOUNTER — Ambulatory Visit: Payer: Self-pay

## 2019-04-13 ENCOUNTER — Ambulatory Visit: Payer: Self-pay

## 2019-09-17 ENCOUNTER — Other Ambulatory Visit: Payer: Self-pay

## 2019-09-17 ENCOUNTER — Ambulatory Visit (INDEPENDENT_AMBULATORY_CARE_PROVIDER_SITE_OTHER): Payer: 59 | Admitting: Family Medicine

## 2019-09-17 ENCOUNTER — Encounter: Payer: Self-pay | Admitting: Family Medicine

## 2019-09-17 ENCOUNTER — Other Ambulatory Visit (HOSPITAL_COMMUNITY)
Admission: RE | Admit: 2019-09-17 | Discharge: 2019-09-17 | Disposition: A | Discharge status: Home / Self Care | Payer: Self-pay | Source: Ambulatory Visit | Attending: Family Medicine | Admitting: Family Medicine

## 2019-09-17 VITALS — BP 110/70 | HR 84 | Ht 62.0 in | Wt 227.2 lb

## 2019-09-17 DIAGNOSIS — M545 Low back pain, unspecified: Secondary | ICD-10-CM | POA: Insufficient documentation

## 2019-09-17 DIAGNOSIS — Z0000 Encounter for general adult medical examination without abnormal findings: Secondary | ICD-10-CM | POA: Diagnosis not present

## 2019-09-17 DIAGNOSIS — G8929 Other chronic pain: Secondary | ICD-10-CM | POA: Diagnosis not present

## 2019-09-17 DIAGNOSIS — Z1211 Encounter for screening for malignant neoplasm of colon: Secondary | ICD-10-CM | POA: Diagnosis not present

## 2019-09-17 DIAGNOSIS — Z1231 Encounter for screening mammogram for malignant neoplasm of breast: Secondary | ICD-10-CM | POA: Diagnosis not present

## 2019-09-17 DIAGNOSIS — Z124 Encounter for screening for malignant neoplasm of cervix: Secondary | ICD-10-CM

## 2019-09-17 DIAGNOSIS — Z1159 Encounter for screening for other viral diseases: Secondary | ICD-10-CM

## 2019-09-17 DIAGNOSIS — R079 Chest pain, unspecified: Secondary | ICD-10-CM | POA: Diagnosis not present

## 2019-09-17 DIAGNOSIS — R011 Cardiac murmur, unspecified: Secondary | ICD-10-CM | POA: Diagnosis not present

## 2019-09-17 LAB — POCT URINALYSIS DIP (PROADVANTAGE DEVICE)
Bilirubin, UA: NEGATIVE
Blood, UA: NEGATIVE
Glucose, UA: NEGATIVE mg/dL
Ketones, POC UA: NEGATIVE mg/dL
Leukocytes, UA: NEGATIVE
Nitrite, UA: NEGATIVE
Specific Gravity, Urine: 1.03
Urobilinogen, Ur: NEGATIVE
pH, UA: 5.5 (ref 5.0–8.0)

## 2019-09-17 NOTE — Progress Notes (Signed)
Subjective:    Patient ID: Kathryn Miller, female    DOB: [birth year], 54 y.o.   MRN: [id number]  HPI Chief Complaint  Patient presents with  . new pt    new pt/old- cpe. not fasting, pap done today   She is here for a complete physical exam. Has not been here in more than 3 years.  Previous medical care: has been going to ED and saw Dr. Cain Saupe.   Other providers: Cardiology several years ago for heart murmur and chest pain.   States she went to the ED for chest pain and left without being seen. States she had the same sharp and "intense" chest pain 2 days ago while washing dishes. States pain radiated across her chest and down her left arm and lasted approximately 20 minutes. Hx of heart murmur.  Denies palpitations or DOE. No LE edema.   Hematuria in the past.   Right low back pain x years and becoming more constant.  Pain is worse when she goes to bed. Laying down is the worse position. Walking and bending does not aggravate her pain.  States she is taking Motrin  Using Biofreeze and heating pad.  She saw Dr. Jillyn Hidden for the same issue and did not get the recommended lumbar spine XR.    Social history: Lives with her husband, works as a Lawyer Denies smoking, drinking alcohol, drug use  Diet: eating healthier lately  Excerise: nothing lately   Immunizations: Covid vaccine UTD   Health maintenance:  Mammogram: overdue  Colonoscopy: never  Last Gynecological Exam: years  Last Menstrual cycle: several months ago  Last Dental Exam: has upcoming appt  Last Eye Exam: last year   Wears seatbelt always, smoke detectors in home and functioning, does not text while driving and feels safe in home environment.   Reviewed allergies, medications, past medical, surgical, family, and social history.   Review of Systems Review of Systems Constitutional: -fever, -chills, -sweats, -unexpected weight change,-fatigue ENT: -runny nose, -ear pain, -sore throat Cardiology:  +chest  pain, -palpitations, -edema Respiratory: -cough, -shortness of breath, -wheezing Gastroenterology: -abdominal pain, -nausea, -vomiting, -diarrhea, -constipation  Hematology: -bleeding or bruising problems Musculoskeletal: -arthralgias, -myalgias, -joint swelling, +back pain Ophthalmology: -vision changes Urology: -dysuria, -difficulty urinating, -hematuria, -urinary frequency, -urgency Neurology: -headache, -weakness, -tingling, -numbness       Objective:   Physical Exam BP 110/70   Pulse 84   Ht 5\' 2"  (1.575 m)   Wt 227 lb 3.2 oz (103.1 kg)   BMI 41.56 kg/m   General Appearance:    Alert, cooperative, no distress, appears stated age  Head:    Normocephalic, without obvious abnormality, atraumatic  Eyes:    PERRL, conjunctiva/corneas clear, EOM's intact  Ears:    Normal TM's and external ear canals  Nose:   Mask on   Throat:   Mask on   Neck:   Supple, no lymphadenopathy;  thyroid:  no   enlargement/tenderness/nodules; no JVD  Back:    Spine nontender, no curvature, ROM normal, no CVA     Tenderness. Negative straight leg raise   Lungs:     Clear to auscultation bilaterally without wheezes, rales or     ronchi; respirations unlabored  Chest Wall:    No tenderness or deformity   Heart:    Regular rate and rhythm, S1 and S2 normal. Slight murmur  Breast Exam:    No tenderness, masses, or nipple discharge or inversion.      No  axillary lymphadenopathy  Abdomen:     Soft, non-tender, nondistended, normoactive bowel sounds,    no masses, no hepatosplenomegaly  Genitalia:    Normal external genitalia without lesions.  BUS and vagina normal. Part of cervix visualized but difficult to fully assess due to body habitus.  No abnormal vaginal discharge.   Pap performed. Chaperone present.      Extremities:   No clubbing, cyanosis or edema  Pulses:   2+ and symmetric all extremities  Skin:   Skin color, texture, turgor normal, no rashes or lesions  Lymph nodes:   Cervical, supraclavicular,  and axillary nodes normal  Neurologic:   CNII-XII intact, normal strength, sensation and gait; reflexes 2+ and symmetric throughout          Psych:   Normal mood, affect, hygiene and grooming.        Assessment & Plan:  Routine general medical examination at a health care facility - Plan: CBC with Differential/Platelet, Comprehensive metabolic panel, TSH, T4, free, T3, Lipid panel -discussed preventive healthcare and will attempt to update. She has not been here in more than 3 years. Counseling on healthy diet for weight loss. Immunizations reviewed. Discussed safety and health promotion.   Screen for colon cancer - Plan: Ambulatory referral to Gastroenterology -screening colonoscopy overdue   Encounter for screening mammogram for malignant neoplasm of breast - Plan: MM DIGITAL SCREENING BILATERAL -she is aware that she needs to call and schedule mammogram.   Screening for cervical cancer -pap smear done but it was difficulty to visualize the cervix due to body habitus. Chaperone present  Morbid obesity (HCC) - Plan: TSH, T4, free, T3, Lipid panel -counseling on healthier diet and increased exercise   Chronic right-sided low back pain without sciatica - Plan: DG Lumbar Spine Complete -no red flag symptoms. I will send her for a L/S XR. Follow up pending result. Consider PT or ortho referral   Need for hepatitis C screening test - Plan: Hepatitis C antibody -done per screening guidelines   SYSTOLIC MURMUR - Plan: EKG 12-Lead, Ambulatory referral to Cardiology -chronic murmur  Chest pain of uncertain etiology - Plan: EKG 12-Lead, Ambulatory referral to Cardiology -no acute distress. Is not actively having pain. Has not seen cardiologist in years and cannot recall who she saw then.   UA negative today  EKG shows NSR, rate 63, no acute ST changes.

## 2019-09-17 NOTE — Patient Instructions (Addendum)
CALL AND SCHEDULE YOUR MAMMOGRAM AT THE [facility]   [facility] WILL CALL YOU TO SCHEDULE A VISIT TO DISCUSS SCREENING FOR COLON CANCER.   YOU WILL HEAR FROM [facility] TO SCHEDULE A VISIT TO DISCUSS YOUR CHEST PAIN AND HEART MURMUR.   YOU CAN GO TO [facility] TO GET THE X RAY OF YOUR LOW BACK  WE WILL BE IN TOUCH WITH YOUR LAB RESULTS       Preventive Care 66-55 Years Old, Female Preventive care refers to visits with your health care provider and lifestyle choices that can promote health and wellness. This includes:  A yearly physical exam. This may also be called an annual well check.  Regular dental visits and eye exams.  Immunizations.  Screening for certain conditions.  Healthy lifestyle choices, such as eating a healthy diet, getting regular exercise, not using drugs or products that contain nicotine and tobacco, and limiting alcohol use. What can I expect for my preventive care visit? Physical exam Your health care provider will check your:  Height and weight. This may be used to calculate body mass index (BMI), which tells if you are at a healthy weight.  Heart rate and blood pressure.  Skin for abnormal spots. Counseling Your health care provider may ask you questions about your:  Alcohol, tobacco, and drug use.  Emotional well-being.  Home and relationship well-being.  Sexual activity.  Eating habits.  Work and work Statistician.  Method of birth control.  Menstrual cycle.  Pregnancy history. What immunizations do I need?  Influenza (flu) vaccine  This is recommended every year. Tetanus, diphtheria, and pertussis (Tdap) vaccine  You may need a Td booster every 10 years. Varicella (chickenpox) vaccine  You may need this if you have not been vaccinated. Zoster (shingles) vaccine  You may need this after age 20. Measles, mumps, and rubella (MMR) vaccine  You may need at least one dose of MMR if you were born in 1957 or  later. You may also need a second dose. Pneumococcal conjugate (PCV13) vaccine  You may need this if you have certain conditions and were not previously vaccinated. Pneumococcal polysaccharide (PPSV23) vaccine  You may need one or two doses if you smoke cigarettes or if you have certain conditions. Meningococcal conjugate (MenACWY) vaccine  You may need this if you have certain conditions. Hepatitis A vaccine  You may need this if you have certain conditions or if you travel or work in places where you may be exposed to hepatitis A. Hepatitis B vaccine  You may need this if you have certain conditions or if you travel or work in places where you may be exposed to hepatitis B. Haemophilus influenzae type b (Hib) vaccine  You may need this if you have certain conditions. Human papillomavirus (HPV) vaccine  If recommended by your health care provider, you may need three doses over 6 months. You may receive vaccines as individual doses or as more than one vaccine together in one shot (combination vaccines). Talk with your health care provider about the risks and benefits of combination vaccines. What tests do I need? Blood tests  Lipid and cholesterol levels. These may be checked every 5 years, or more frequently if you are over 55 years old.  Hepatitis C test.  Hepatitis B test. Screening  Lung cancer screening. You may have this screening every year starting at age 43 if you have a 30-pack-year history of smoking and currently smoke or have quit within the  past 15 years.  Colorectal cancer screening. All adults should have this screening starting at age 83 and continuing until age 60. Your health care provider may recommend screening at age 90 if you are at increased risk. You will have tests every 1-10 years, depending on your results and the type of screening test.  Diabetes screening. This is done by checking your blood sugar (glucose) after you have not eaten for a while  (fasting). You may have this done every 1-3 years.  Mammogram. This may be done every 1-2 years. Talk with your health care provider about when you should start having regular mammograms. This may depend on whether you have a family history of breast cancer.  BRCA-related cancer screening. This may be done if you have a family history of breast, ovarian, tubal, or peritoneal cancers.  Pelvic exam and Pap test. This may be done every 3 years starting at age 54. Starting at age 2, this may be done every 5 years if you have a Pap test in combination with an HPV test. Other tests  Sexually transmitted disease (STD) testing.  Bone density scan. This is done to screen for osteoporosis. You may have this scan if you are at high risk for osteoporosis. Follow these instructions at home: Eating and drinking  Eat a diet that includes fresh fruits and vegetables, whole grains, lean protein, and low-fat dairy.  Take vitamin and mineral supplements as recommended by your health care provider.  Do not drink alcohol if: ? Your health care provider tells you not to drink. ? You are pregnant, may be pregnant, or are planning to become pregnant.  If you drink alcohol: ? Limit how much you have to 0-1 drink a day. ? Be aware of how much alcohol is in your drink. In the [place] one drink equals one 12 oz bottle of beer (355 mL), one 5 oz glass of wine (148 mL), or one 1 oz glass of hard liquor (44 mL). Lifestyle  Take daily care of your teeth and gums.  Stay active. Exercise for at least 30 minutes on 5 or more days each week.  Do not use any products that contain nicotine or tobacco, such as cigarettes, e-cigarettes, and chewing tobacco. If you need help quitting, ask your health care provider.  If you are sexually active, practice safe sex. Use a condom or other form of birth control (contraception) in order to prevent pregnancy and STIs (sexually transmitted infections).  If told by your health  care provider, take low-dose aspirin daily starting at age 15. What's next?  Visit your health care provider once a year for a well check visit.  Ask your health care provider how often you should have your eyes and teeth checked.  Stay up to date on all vaccines. This information is not intended to replace advice given to you by your health care provider. Make sure you discuss any questions you have with your health care provider. Document Revised: 09/15/2017 Document Reviewed: 09/15/2017 Elsevier Patient Education  2020 [employer].

## 2019-09-18 LAB — CBC WITH DIFFERENTIAL/PLATELET
Basophils Absolute: 0 10*3/uL (ref 0.0–0.2)
Basos: 0 %
EOS (ABSOLUTE): 0.1 10*3/uL (ref 0.0–0.4)
Eos: 2 %
Hematocrit: 36.3 % (ref 34.0–46.6)
Hemoglobin: 12.4 g/dL (ref 11.1–15.9)
Immature Grans (Abs): 0 10*3/uL (ref 0.0–0.1)
Immature Granulocytes: 0 %
Lymphocytes Absolute: 2.8 10*3/uL (ref 0.7–3.1)
Lymphs: 39 %
MCH: 26.2 pg — ABNORMAL LOW (ref 26.6–33.0)
MCHC: 34.2 g/dL (ref 31.5–35.7)
MCV: 77 fL — ABNORMAL LOW (ref 79–97)
Monocytes Absolute: 0.6 10*3/uL (ref 0.1–0.9)
Monocytes: 8 %
Neutrophils Absolute: 3.7 10*3/uL (ref 1.4–7.0)
Neutrophils: 51 %
Platelets: 292 10*3/uL (ref 150–450)
RBC: 4.74 x10E6/uL (ref 3.77–5.28)
RDW: 15.7 % — ABNORMAL HIGH (ref 11.7–15.4)
WBC: 7.3 10*3/uL (ref 3.4–10.8)

## 2019-09-18 LAB — COMPREHENSIVE METABOLIC PANEL
ALT: 12 IU/L (ref 0–32)
AST: 11 IU/L (ref 0–40)
Albumin/Globulin Ratio: 1.3 (ref 1.2–2.2)
Albumin: 4.3 g/dL (ref 3.8–4.9)
Alkaline Phosphatase: 99 IU/L (ref 48–121)
BUN/Creatinine Ratio: 30 — ABNORMAL HIGH (ref 9–23)
BUN: 17 mg/dL (ref 6–24)
Bilirubin Total: <0.2 mg/dL (ref 0.0–1.2)
CO2: 23 mmol/L (ref 20–29)
Calcium: 9.2 mg/dL (ref 8.7–10.2)
Chloride: 103 mmol/L (ref 96–106)
Creatinine, Ser: 0.56 mg/dL — ABNORMAL LOW (ref 0.57–1.00)
GFR calc Af Amer: 121 mL/min/{1.73_m2} (ref >59)
GFR calc non Af Amer: 105 mL/min/{1.73_m2} (ref >59)
Globulin, Total: 3.3 g/dL (ref 1.5–4.5)
Glucose: 97 mg/dL (ref 65–99)
Potassium: 4 mmol/L (ref 3.5–5.2)
Sodium: 139 mmol/L (ref 134–144)
Total Protein: 7.6 g/dL (ref 6.0–8.5)

## 2019-09-18 LAB — LIPID PANEL
Chol/HDL Ratio: 3.2 ratio (ref 0.0–4.4)
Cholesterol, Total: 216 mg/dL — ABNORMAL HIGH (ref 100–199)
HDL: 67 mg/dL (ref >39)
LDL Chol Calc (NIH): 139 mg/dL — ABNORMAL HIGH (ref 0–99)
Triglycerides: 55 mg/dL (ref 0–149)
VLDL Cholesterol Cal: 10 mg/dL (ref 5–40)

## 2019-09-18 LAB — T4, FREE: Free T4: 1.12 ng/dL (ref 0.82–1.77)

## 2019-09-18 LAB — T3: T3, Total: 108 ng/dL (ref 71–180)

## 2019-09-18 LAB — HEPATITIS C ANTIBODY: Hep C Virus Ab: <0.1 s/co ratio (ref 0.0–0.9)

## 2019-09-18 LAB — TSH: TSH: 0.884 u[IU]/mL (ref 0.450–4.500)

## 2019-09-19 LAB — CYTOLOGY - PAP
Comment: NEGATIVE
Diagnosis: NEGATIVE
High risk HPV: NEGATIVE

## 2019-09-27 ENCOUNTER — Encounter: Payer: Self-pay | Admitting: General Practice

## 2019-10-03 ENCOUNTER — Ambulatory Visit: Payer: Self-pay

## 2019-10-04 ENCOUNTER — Ambulatory Visit: Payer: Self-pay

## 2019-10-05 ENCOUNTER — Ambulatory Visit: Payer: Self-pay | Admitting: Internal Medicine

## 2019-10-12 NOTE — Progress Notes (Deleted)
Cardiology Office Note:    Date:  10/12/2019   ID:  Kathryn Miller, DOB [birth year], MRN [id number]  PCP:  Avanell Shackleton, NP-C  [facility] Cardiologist:  No primary care provider on file.  [facility] Electrophysiologist:  None   Referring MD: Avanell Shackleton, NP-C    History of Present Illness:    Kathryn Miller is a 54 y.o. female with a history of anemia who was referred by Hetty Blend, NP for evaluation of heart murmur and chest pain.   Labs 2019-09-27: TC 216, HDL 67, LDL 139, TG 55 Cr 6.26, K 4, HgB 12.4   ECG 09/17/19: NSR with no ischemic changes  Past Medical History:  Diagnosis Date   Anemia    Chest pain    Murmur, cardiac     Past Surgical History:  Procedure Laterality Date   CESAREAN SECTION  [phone]   TUBAL LIGATION  2004    Current Medications: No outpatient medications have been marked as taking for the 10/15/19 encounter (Appointment) with Meriam Sprague, MD.     Allergies:   Codeine   Social History   Socioeconomic History   Marital status: Married    Spouse name: Not on file   Number of children: Not on file   Years of education: Not on file   Highest education level: Not on file  Occupational History   Occupation: unemployed  Tobacco Use   Smoking status: Never Smoker   Smokeless tobacco: Never Used  Building services engineer Use: Never used  Substance and Sexual Activity   Alcohol use: No   Drug use: No   Sexual activity: Not on file  Other Topics Concern   Not on file  Social History Narrative   Domestic partner since 1982 and father of children but did not marry.   Social Determinants of Health   Financial Resource Strain:    Difficulty of Paying Living Expenses: Not on file  Food Insecurity:    Worried About Programme researcher, broadcasting/film/video in the Last Year: Not on file   [employer] of Food in the Last Year: Not on file  Transportation Needs:    Lack of Transportation (Medical): Not on file     Lack of Transportation (Non-Medical): Not on file  Physical Activity:    Days of Exercise per Week: Not on file   Minutes of Exercise per Session: Not on file  Stress:    Feeling of Stress : Not on file  Social Connections:    Frequency of Communication with Friends and Family: Not on file   Frequency of Social Gatherings with Friends and Family: Not on file   Attends Religious Services: Not on file   Active Member of Clubs or Organizations: Not on file   Attends Banker Meetings: Not on file   Marital Status: Not on file     Family History: The patient's ***family history includes Breast cancer in her mother; Diabetes in her mother; Hypertension in her mother.  ROS:   Please see the history of present illness.    *** All other systems reviewed and are negative.  EKGs/Labs/Other Studies Reviewed:    The following studies were reviewed today: ECG 09-27-2019: NSR without ischemic changes  EKG:  EKG is *** ordered today.  The ekg ordered today demonstrates ***  Recent Labs: 09/17/2019: ALT 12; BUN 17; Creatinine, Ser 0.56; Hemoglobin 12.4; Platelets 292; Potassium 4.0; Sodium 139; TSH 0.884  Recent Lipid Panel  Component Value Date/Time   CHOL 216 (H) 09/17/2019 1049   TRIG 55 09/17/2019 1049   HDL 67 09/17/2019 1049   CHOLHDL 3.2 09/17/2019 1049   CHOLHDL 2.6 05/05/2016 0745   VLDL 10 05/05/2016 0745   LDLCALC 139 (H) 09/17/2019 1049    Physical Exam:    VS:  There were no vitals taken for this visit.    Wt Readings from Last 3 Encounters:  09/17/19 227 lb 3.2 oz (103.1 kg)  12/29/18 217 lb (98.4 kg)  09/16/18 230 lb (104.3 kg)     GEN: *** Well nourished, well developed in no acute distress HEENT: Normal NECK: No JVD; No carotid bruits LYMPHATICS: No lymphadenopathy CARDIAC: ***RRR, no murmurs, rubs, gallops RESPIRATORY:  Clear to auscultation without rales, wheezing or rhonchi  ABDOMEN: Soft, non-tender,  non-distended MUSCULOSKELETAL:  No edema; No deformity  SKIN: Warm and dry NEUROLOGIC:  Alert and oriented x 3 PSYCHIATRIC:  Normal affect   ASSESSMENT:    No diagnosis found. PLAN:    In order of problems listed above:  #Chest pain:  #Murmur:   Medication Adjustments/Labs and Tests Ordered: Current medicines are reviewed at length with the patient today.  Concerns regarding medicines are outlined above.  No orders of the defined types were placed in this encounter.  No orders of the defined types were placed in this encounter.   There are no Patient Instructions on file for this visit.   Signed, Meriam Sprague, MD  10/12/2019 8:17 PM    [facility]

## 2019-10-15 ENCOUNTER — Ambulatory Visit: Payer: Self-pay | Admitting: Cardiology

## 2019-10-18 ENCOUNTER — Inpatient Hospital Stay: Admission: RE | Admit: 2019-10-18 | Payer: Self-pay | Source: Ambulatory Visit

## 2019-10-19 ENCOUNTER — Other Ambulatory Visit: Payer: Self-pay

## 2019-10-19 ENCOUNTER — Ambulatory Visit
Admission: RE | Admit: 2019-10-19 | Discharge: 2019-10-19 | Disposition: A | Discharge status: Home / Self Care | Payer: 59 | Source: Ambulatory Visit | Attending: Family Medicine | Admitting: Family Medicine

## 2019-10-19 DIAGNOSIS — G8929 Other chronic pain: Secondary | ICD-10-CM

## 2019-10-19 DIAGNOSIS — Z1231 Encounter for screening mammogram for malignant neoplasm of breast: Secondary | ICD-10-CM

## 2019-10-21 NOTE — Progress Notes (Signed)
Please refer her to [facility] for chronic right low back pain.

## 2019-10-22 ENCOUNTER — Other Ambulatory Visit: Payer: Self-pay | Admitting: Internal Medicine

## 2019-10-22 DIAGNOSIS — M545 Low back pain, unspecified: Secondary | ICD-10-CM

## 2019-10-22 DIAGNOSIS — G8929 Other chronic pain: Secondary | ICD-10-CM

## 2019-10-23 ENCOUNTER — Telehealth: Payer: Self-pay | Admitting: *Deleted

## 2019-10-23 NOTE — Telephone Encounter (Signed)
Agree, thanks for letting us know. She should fully complete her cardiac workup prior to proceeding with elective screening colonoscopy. Thanks She can contact us for scheduling once her evaluation has been completed.

## 2019-10-23 NOTE — Telephone Encounter (Signed)
Dr. Adela Lank and Jonny Ruiz,  This patient was referred for a screening colon as well as a cardiology consult in the same visit with her primary MD.  She is being referred to cardiology for chest pain and a heart murmur.  Would you like for her to have her cardiology consult prior to scheduling the screening colonoscopy or is she ok to proceed? Thank you,  Dorene Grebe

## 2019-10-23 NOTE — Telephone Encounter (Signed)
Phoned pt and explained to her that she should call back to reschedule PV and screening colonoscopy once evaluation by a cardiologist and work up has been completed.  PV and procedure cancelled.

## 2019-10-23 NOTE — Telephone Encounter (Signed)
Kathryn Miller,  Yes it would be prudent to wait for the results of the CARDS w/u before proceeding with her GI procedure.  Thanks,  [name]

## 2019-10-24 ENCOUNTER — Ambulatory Visit: Payer: 59 | Admitting: Family Medicine

## 2019-10-26 ENCOUNTER — Encounter: Payer: Self-pay | Admitting: Family Medicine

## 2019-10-29 ENCOUNTER — Other Ambulatory Visit: Payer: Self-pay

## 2019-10-29 ENCOUNTER — Encounter: Payer: Self-pay | Admitting: Family Medicine

## 2019-10-29 ENCOUNTER — Ambulatory Visit (INDEPENDENT_AMBULATORY_CARE_PROVIDER_SITE_OTHER): Payer: 59 | Admitting: Family Medicine

## 2019-10-29 DIAGNOSIS — M545 Low back pain, unspecified: Secondary | ICD-10-CM | POA: Diagnosis not present

## 2019-10-29 NOTE — Progress Notes (Signed)
I saw and examined the patient with Dr. Marga Hoots and agree with assessment and plan as outlined.    Right-sided low back/posterior hip pain without radicular symptoms.  Very tender along the gluteus medius area and a little bit over the SI joint.  Elected to inject the area of maximum tenderness today with 6 mg betamethasone and 5 cc 1% lidocaine without epinephrine.  If symptoms persist, then physical therapy.  If that does not help, then possible SI joint injection.

## 2019-10-29 NOTE — Progress Notes (Signed)
Office Visit Note   Patient: Kathryn Miller           Date of Birth: 1965/07/21           MRN: [id number] Visit Date: 10/29/2019 Requested by: Avanell Shackleton, NP-C [address] [place],  [place] [address] PCP: Avanell Shackleton, NP-C  Subjective: Chief Complaint  Patient presents with  . Lower Back - Pain    HPI: 54yo F presenting to clinic with concerns of right-sided lower back pain, which has been ongoing for several months. Patient states that her pain is focused primarily in the lower back/right buttocks, though will occasionally move to the left side. Patient does not travel down her leg, and she denies any numbness/weakness in the extremity. She has tried ibuprofen, but this has not offered any significant improvement. She works as a Lawyer in a dementia unit, and states this requires her to be on her feet throughout the day, and she has to be able to quickly respond to resident needs. No bowel/bladder dysfunction, no red flags.               ROS:   All other systems were reviewed and are negative.  Objective: Vital Signs: There were no vitals taken for this visit.  Physical Exam:  General:  Alert and oriented, in no acute distress. Pulm:  Breathing unlabored. Psy:  Normal mood, congruent affect. Skin:  No bruises, no rashes.   BACK EXAMINATION: Normal Gait.  Stands with excessive lumbar lordosis.  Palpation: Endorses pain with palpation of the right SI Joint, as well as within the right gluteal musculature.  No midline tenderness, no deformity. No lumbar paraspinal muscle tenderness.   Strength: Hip flexion (L1), Hip Aduction (L2), Knee Extension (L3) are 5/5 Bilaterally Foot Inversion (L4), Dorsiflexion (L5), and Eversion (S1) 5/5 Bilaterally Sensation: Intact to light touch medial and lateral aspects of lower extremities, and lateral, dorsal, and medial aspects of foot.  Reflexes: Patellar (L4), and Ankle (S1) 2+ Bilaterally Special Tests:  FABER: worsens SI Joint  pain on Right. Piriformis test: No pain or radiation into leg with hip flexion, adduction SLR: No radiation down Ipilateral or contralateral leg bilaterally    Imaging: None Today.  Assessment & Plan: 54yo F presenting to clinic with concerns of R back/hip pain. Examination as above, which is significant for pain at the R SI Joint, as well as within the gluteal musculature on the right. Reassuring that this does NOT radiate, and is not associated with any red flag symptoms. Given patient's discomfort, offered injection therapy today, which she was agreeable to. Risks and benefits discussed, and procedure performed as described below, which patient tolerated very well. She voiced immediate improvement in her pain following injection.  -If improvement does not last, pt should return to clinic for consideration of US Guided SI Injection or physical therapy referral -Patient had no further questions or concerns today.      Procedures: Right Gluteal Trigger point Cortisone Injection:  Risks and benefits of procedure discussed, Patient opted to proceed. Verbal Consent obtained.  Timeout performed.  Skin prepped in a sterile fashion with betadine before further cleansing with alcohol. Ethyl Chloride was used for topical analgesia.  Area of most tenderness within the R gluteal musculature was injected with 5cc 1% Lidocaine without epinephrine using a spinal needle. Syringe was removed from the needle, and 6mg  betamethasone was then injected into the area. Patient tolerated the injection well with no immediate complications. Aftercare instructions were  discussed, and patient was given strict return precautions.      PMFS History: Patient Active Problem List   Diagnosis Date Noted  . Chronic right-sided low back pain without sciatica 09/17/2019  . Morbid obesity (HCC) 04/08/2016  . Screen for colon cancer 04/08/2016  . HYPERTHYROIDISM 07/16/2009  . CHALAZION 07/15/2009  . SYSTOLIC MURMUR  40/98/1191  . METRORRHAGIA 10/07/2008  . BACTERIAL VAGINITIS 02/28/2008  . HOT FLASHES 02/28/2008  . Pain in limb 08/30/2007  . DENTAL CARIES 08/08/2007  . ALLERGIC RHINITIS 06/02/2007  . OTITIS MEDIA, LEFT 03/09/2007  . CARBUNCLE, ARM 03/09/2007  . PLANTAR FASCIITIS 05/21/2006  . ANEMIA-IRON DEFICIENCY 11/16/2005   Past Medical History:  Diagnosis Date  . Anemia   . Chest pain   . Murmur, cardiac     Family History  Problem Relation Age of Onset  . Breast cancer Mother   . Diabetes Mother   . Hypertension Mother     Past Surgical History:  Procedure Laterality Date  . CESAREAN SECTION  [phone]  . TUBAL LIGATION  2004   Social History   Occupational History  . Occupation: unemployed  Tobacco Use  . Smoking status: Never Smoker  . Smokeless tobacco: Never Used  Vaping Use  . Vaping Use: Never used  Substance and Sexual Activity  . Alcohol use: No  . Drug use: No  . Sexual activity: Not on file

## 2019-10-29 NOTE — Progress Notes (Signed)
Lower back pain with no leg pain Unable to roll over if laying on back Pain for a couple of months Works as a Special educational needs teacher helping  No conservative treatment done other than nsaids

## 2019-10-30 NOTE — Progress Notes (Signed)
Cardiology Office Note:    Date:  11/02/2019   ID:  Kathryn Miller, DOB 06/02/1965, MRN [id number]  PCP:  Avanell Shackleton, NP-C  [facility] Cardiologist:  No primary care provider on file.  [facility] Electrophysiologist:  None   Referring MD: Avanell Shackleton, NP-C    History of Present Illness:    Kathryn Miller is a 54 y.o. female with a hx of anemia and obesity who was referred by Hetty Blend, NP for evaluation of a cardiac murmur and chest pain.  Patient states that she had some left sided chest pain while washing dishes that lasted for 15 minutes. Pain was described as "piercing/squeezing pain." Pain radiated to the left side of her neck and shoulder. No associated shortness of breath, diaphoresis, nausea or vomiting. Had 2 other episodes with last about 1.5 months ago. Last episode occurred when she was walking back from the bathroom. Pain improves with deep breathing.  Also notes that she gets some chest pressure when walking a flight of stairs but not when walking on flat ground.   Has LE edema in the feet and ankles bilaterally which is worse after prolonged standing during work. No orthopnea or PND  Family history: son with subaortic stenosis s/p resection at age 59 and pericardial effusion s/p pericardial windo (now followed by Dr. Tenny Craw); aunt with history of multiple cardiac surgeries (unknown types). No other known cardiac disease on her side of the family.  ECG 08/2019: NSR with HR 63  Past Medical History:  Diagnosis Date  . Anemia   . Chest pain   . Murmur, cardiac     Past Surgical History:  Procedure Laterality Date  . CESAREAN SECTION  [phone]  . TUBAL LIGATION  2004    Current Medications: Current Meds  Medication Sig  . Ibuprofen (MOTRIN PO) Take by mouth as needed.   Marland Kitchen NAPROXEN SODIUM PO Take by mouth as needed.      Allergies:   Codeine   Social History   Socioeconomic History  . Marital status: Married    Spouse  name: Not on file  . Number of children: Not on file  . Years of education: Not on file  . Highest education level: Not on file  Occupational History  . Occupation: unemployed  Tobacco Use  . Smoking status: Never Smoker  . Smokeless tobacco: Never Used  Vaping Use  . Vaping Use: Never used  Substance and Sexual Activity  . Alcohol use: No  . Drug use: No  . Sexual activity: Not on file  Other Topics Concern  . Not on file  Social History Narrative   Domestic partner since 1982 and father of children but did not marry.   Social Determinants of Health   Financial Resource Strain:   . Difficulty of Paying Living Expenses: Not on file  Food Insecurity:   . Worried About Programme researcher, broadcasting/film/video in the Last Year: Not on file  . Ran Out of Food in the Last Year: Not on file  Transportation Needs:   . Lack of Transportation (Medical): Not on file  . Lack of Transportation (Non-Medical): Not on file  Physical Activity:   . Days of Exercise per Week: Not on file  . Minutes of Exercise per Session: Not on file  Stress:   . Feeling of Stress : Not on file  Social Connections:   . Frequency of Communication with Friends and Family: Not on file  . Frequency of  Social Gatherings with Friends and Family: Not on file  . Attends Religious Services: Not on file  . Active Member of Clubs or Organizations: Not on file  . Attends Banker Meetings: Not on file  . Marital Status: Not on file     Family History: The patient's family history includes Breast cancer in her mother; Diabetes in her mother; Hypertension in her mother.  ROS:   Please see the history of present illness.    The patient denies dyspnea at rest, PND, orthopnea, Denies cough, fever, chills. Denies nausea, vomiting. Denies syncope or presyncope. Denies dizziness or lightheadedness.   EKGs/Labs/Other Studies Reviewed:    The following studies were reviewed today: ECG 10/02/2019 with NSR HR 63; no ischemia, no  block  Recent Labs: 09/17/2019: ALT 12; BUN 17; Creatinine, Ser 0.56; Hemoglobin 12.4; Platelets 292; Potassium 4.0; Sodium 139; TSH 0.884  Recent Lipid Panel    Component Value Date/Time   CHOL 216 (H) 09/17/2019 1049   TRIG 55 09/17/2019 1049   HDL 67 09/17/2019 1049   CHOLHDL 3.2 09/17/2019 1049   CHOLHDL 2.6 05/05/2016 0745   VLDL 10 05/05/2016 0745   LDLCALC 139 (H) 09/17/2019 1049     Physical Exam:    VS:  BP 128/76   Pulse 74   Ht 5\' 2"  (1.575 m)   Wt 226 lb (102.5 kg)   SpO2 97%   BMI 41.34 kg/m     Wt Readings from Last 3 Encounters:  11/02/19 226 lb (102.5 kg)  09/17/19 227 lb 3.2 oz (103.1 kg)  12/29/18 217 lb (98.4 kg)     GEN:  Well nourished, well developed in no acute distress HEENT: Normal NECK: No JVD; No carotid bruits LYMPHATICS: No lymphadenopathy CARDIAC: RRR, 3/6 harsh systolic murmur best heard at RUSB. No rubs, gallops RESPIRATORY:  Clear to auscultation without rales, wheezing or rhonchi  ABDOMEN: Soft, non-tender, non-distended MUSCULOSKELETAL:  No edema; No deformity  SKIN: Warm and dry NEUROLOGIC:  Alert and oriented x 3 PSYCHIATRIC:  Normal affect   ASSESSMENT:    1. Precordial pain   2. Murmur    PLAN:    In order of problems listed above:  #Chest Pain: Patient with episodes with what seems like exertional chest pain that is relieved with deep breathing. Pain is described as piercing/squeezing and lasts 14/11/20 or so at time before abating. Not classic angina, however, exertional component is concerning. Risk factors include obesity. -Check exercise myoview   #Systolic heart murmur: Patient with early peaking, harsh systolic murmur best heard in RUSB. Possible AS although likely not severe based on exam. -Check TTE  Medication Adjustments/Labs and Tests Ordered: Current medicines are reviewed at length with the patient today.  Concerns regarding medicines are outlined above.  Orders Placed This Encounter  Procedures  .  Myocardial Perfusion Imaging  . ECHOCARDIOGRAM COMPLETE   No orders of the defined types were placed in this encounter.   Patient Instructions  Medication Instructions:   Your physician recommends that you continue on your current medications as directed. Please refer to the Current Medication list given to you today.  *If you need a refill on your cardiac medications before your next appointment, please call your pharmacy*  Testing/Procedures:  Your physician has requested that you have an echocardiogram. Echocardiography is a painless test that uses sound waves to create images of your heart. It provides your doctor with information about the size and shape of your heart and how well your  heart's chambers and valves are working. This procedure takes approximately one hour. There are no restrictions for this procedure.  Your physician has requested that you have en exercise stress myoview. For further information please visit [email or web address] Please follow instruction sheet, as given.  Follow-Up: At [facility], you and your health needs are our priority.  As part of our continuing mission to provide you with exceptional heart care, we have created designated Provider Care Teams.  These Care Teams include your primary Cardiologist (physician) and Advanced Practice Providers (APPs -  Physician Assistants and Nurse Practitioners) who all work together to provide you with the care you need, when you need it.  We recommend signing up for the patient portal called "MyChart".  Sign up information is provided on this After Visit Summary.  MyChart is used to connect with patients for Virtual Visits (Telemedicine).  Patients are able to view lab/test results, encounter notes, upcoming appointments, etc.  Non-urgent messages can be sent to your provider as well.   To learn more about what you can do with MyChart, go to [email or web address].    Your next appointment:   6 month(s)  The  format for your next appointment:   In Person  Provider:   Laurance Flatten, MD        Signed, Meriam Sprague, MD  11/02/2019 11:07 AM     [facility]

## 2019-11-02 ENCOUNTER — Ambulatory Visit (INDEPENDENT_AMBULATORY_CARE_PROVIDER_SITE_OTHER): Payer: 59 | Admitting: Cardiology

## 2019-11-02 ENCOUNTER — Encounter: Payer: Self-pay | Admitting: *Deleted

## 2019-11-02 ENCOUNTER — Encounter: Payer: Self-pay | Admitting: Cardiology

## 2019-11-02 ENCOUNTER — Other Ambulatory Visit: Payer: Self-pay

## 2019-11-02 VITALS — BP 128/76 | HR 74 | Ht 62.0 in | Wt 226.0 lb

## 2019-11-02 DIAGNOSIS — R011 Cardiac murmur, unspecified: Secondary | ICD-10-CM | POA: Diagnosis not present

## 2019-11-02 DIAGNOSIS — R072 Precordial pain: Secondary | ICD-10-CM

## 2019-11-02 NOTE — Patient Instructions (Addendum)
Medication Instructions:   Your physician recommends that you continue on your current medications as directed. Please refer to the Current Medication list given to you today.  *If you need a refill on your cardiac medications before your next appointment, please call your pharmacy*  Testing/Procedures:  Your physician has requested that you have an echocardiogram. Echocardiography is a painless test that uses sound waves to create images of your heart. It provides your doctor with information about the size and shape of your heart and how well your heart's chambers and valves are working. This procedure takes approximately one hour. There are no restrictions for this procedure.  Your physician has requested that you have en exercise stress myoview. For further information please visit [email or web address] Please follow instruction sheet, as given.  Follow-Up: At [facility], you and your health needs are our priority.  As part of our continuing mission to provide you with exceptional heart care, we have created designated Provider Care Teams.  These Care Teams include your primary Cardiologist (physician) and Advanced Practice Providers (APPs -  Physician Assistants and Nurse Practitioners) who all work together to provide you with the care you need, when you need it.  We recommend signing up for the patient portal called "MyChart".  Sign up information is provided on this After Visit Summary.  MyChart is used to connect with patients for Virtual Visits (Telemedicine).  Patients are able to view lab/test results, encounter notes, upcoming appointments, etc.  Non-urgent messages can be sent to your provider as well.   To learn more about what you can do with MyChart, go to [email or web address].    Your next appointment:   6 month(s)  The format for your next appointment:   In Person  Provider:   Laurance Flatten, MD

## 2019-11-07 ENCOUNTER — Telehealth (HOSPITAL_COMMUNITY): Payer: Self-pay | Admitting: *Deleted

## 2019-11-07 ENCOUNTER — Encounter (HOSPITAL_COMMUNITY): Payer: Self-pay | Admitting: *Deleted

## 2019-11-07 NOTE — Telephone Encounter (Signed)
MyChart letter sent outlining instructions for upcoming stress test on 11/12/19 @ 10:30.

## 2019-11-09 ENCOUNTER — Encounter (HOSPITAL_COMMUNITY): Payer: Self-pay | Admitting: *Deleted

## 2019-11-12 ENCOUNTER — Encounter (HOSPITAL_COMMUNITY): Payer: 59

## 2019-11-21 ENCOUNTER — Telehealth (HOSPITAL_COMMUNITY): Payer: Self-pay | Admitting: Cardiology

## 2019-11-21 ENCOUNTER — Telehealth (HOSPITAL_COMMUNITY): Payer: Self-pay | Admitting: *Deleted

## 2019-11-21 NOTE — Telephone Encounter (Signed)
11/21/19 Called patient and left message that we had to cancel her Myoview scheduled for 11/26/19 due to no Prior Authorization from [employer] yet per Anisah in the [employer]. I told patient once we got the PA# we would call to reschedule. ALSO patient will need COVID test to have her Exercise Myoview. Thank you.

## 2019-11-21 NOTE — Telephone Encounter (Signed)
Left message at 1115am  on voicemail per DPR in reference to upcoming appointment scheduled on 11/26/2019 at 1015 with detailed instructions given per Myocardial Perfusion Study Information Sheet for the test. LM to arrive 15 minutes early, and that it is imperative to arrive on time for appointment to keep from having the test rescheduled. If you need to cancel or reschedule your appointment, please call the office within 24 hours of your appointment. Failure to do so may result in a cancellation of your appointment, and a $50 no show fee. Phone number given for call back for any questions. Patient does not have a covid test ordered and precert not obtained again for test.Zeki Bedrosian, Adelene Idler

## 2019-11-23 ENCOUNTER — Other Ambulatory Visit (HOSPITAL_COMMUNITY): Payer: 59

## 2019-11-26 ENCOUNTER — Encounter (HOSPITAL_COMMUNITY): Payer: 59

## 2019-11-29 ENCOUNTER — Encounter: Payer: Self-pay | Admitting: Gastroenterology

## 2019-11-29 ENCOUNTER — Ambulatory Visit: Payer: Self-pay | Admitting: Internal Medicine

## 2019-11-29 ENCOUNTER — Telehealth (HOSPITAL_COMMUNITY): Payer: Self-pay | Admitting: Cardiology

## 2019-11-29 NOTE — Telephone Encounter (Signed)
I called patient to reschedule echocardiogram that she cancelled on 11/23/19 and patient did not want to reschedule at this time and will cal [place] back when she is ready to schedule. Order will be removed from the WQ and we can reinstate when she calls back. Thank you.

## 2019-12-25 ENCOUNTER — Ambulatory Visit (INDEPENDENT_AMBULATORY_CARE_PROVIDER_SITE_OTHER): Payer: 59 | Admitting: Family Medicine

## 2019-12-25 ENCOUNTER — Other Ambulatory Visit: Payer: Self-pay

## 2019-12-25 ENCOUNTER — Encounter: Payer: Self-pay | Admitting: Family Medicine

## 2019-12-25 VITALS — BP 120/70 | HR 69 | Temp 98.1°F | Wt 229.2 lb

## 2019-12-25 DIAGNOSIS — G8929 Other chronic pain: Secondary | ICD-10-CM | POA: Diagnosis not present

## 2019-12-25 DIAGNOSIS — M545 Low back pain, unspecified: Secondary | ICD-10-CM | POA: Diagnosis not present

## 2019-12-25 MED ORDER — PREDNISONE 10 MG (21) PO TBPK: ORAL_TABLET | Freq: Every day | ORAL | 0 refills | Status: DC

## 2019-12-25 NOTE — Patient Instructions (Addendum)
Stop taking the Motrin.  You may take Tylenol 1,000 mg twice daily  I am prescribing a steroid Dosepak for you to try as discussed.  Take the medication as early in the day as possible with a full glass of water and food.  Use ice or heat as frequently as you can.  Continue using a topical pain medication.  Call and schedule a follow-up with Dr. Prince Rome who is also treating you for this issue.  Dr Prince Rome office number is [phone]

## 2019-12-25 NOTE — Progress Notes (Signed)
   Subjective:    Patient ID: Kathryn Miller, female    DOB: [birth year], 54 y.o.   MRN: [id number]  HPI Chief Complaint  Patient presents with  . back pain    back pain- getting worse. seen ortho and got a shot but didn't help   She is here with complaints of chronic low right back pain. States her pain has not changed from her last visit with me and then with her orthopedist. States she received an injection at her orthopedist office but it only lasted for a couple of days. She has not followed up with Dr. Prince Rome as recommended. States she could not figure out how to send Dr. Prince Rome a mychart message and did not know she could call him.   States she is taking 1,000 mg of Motrin daily and Tylenol 1,000 mg 2-3 times per day.   States Motrin is the only thing that helps her pain.   Denies fever, chills, abdominal pain, N/V/D, or urinary symptoms. Denies numbness, tingling or weakness. No loss of control of bowels or bladder.   States she is going out of town in 2 days, has to take the train to [facility] to see her mother. She is concerned that her pain will get worse sitting for long periods.   Reviewed allergies, medications, past medical, surgical, family, and social history.   Review of Systems Pertinent positives and negatives in the history of present illness.     Objective:   Physical Exam Constitutional:      General: She is not in acute distress.    Appearance: She is not ill-appearing.  Cardiovascular:     Rate and Rhythm: Normal rate.  Musculoskeletal:     Cervical back: Normal range of motion and neck supple.     Comments: Marked TTP over right SI joint. Pain with extreme flexion  Skin:    General: Skin is warm and dry.  Neurological:     General: No focal deficit present.     Mental Status: She is alert and oriented to person, place, and time.     Motor: No weakness.     Gait: Gait normal.    BP 120/70   Pulse 69   Temp 98.1 F (36.7 C)   Wt 229 lb 3.2 oz (104 kg)    BMI 41.92 kg/m        Assessment & Plan:  Chronic right-sided low back pain without sciatica - Plan: predniSONE (STERAPRED UNI-PAK 21 TAB) 10 MG (21) TBPK tablet  She will follow up with Dr. Prince Rome as he advised to discuss the next step in treatment. In the meantime, she is concerned about her upcoming train ride to [facility] and is requesting assistance with pain management. She is aware that she is taking more ibuprofen than recommended and the risks involved. She will stop NSAIDs for now. I will prescribe a steroid dose pak as she reports doing well with this in the past. Discussed how to take the does pak and potential side effects. She may take Tylenol if needed. I also recommend ice or heat and topical analgesic.

## 2020-01-08 ENCOUNTER — Ambulatory Visit (INDEPENDENT_AMBULATORY_CARE_PROVIDER_SITE_OTHER): Payer: 59 | Admitting: Family Medicine

## 2020-01-08 ENCOUNTER — Other Ambulatory Visit: Payer: Self-pay

## 2020-01-08 ENCOUNTER — Encounter: Payer: Self-pay | Admitting: Family Medicine

## 2020-01-08 DIAGNOSIS — M545 Low back pain, unspecified: Secondary | ICD-10-CM | POA: Diagnosis not present

## 2020-01-08 MED ORDER — PREDNISONE 10 MG PO TABS: ORAL_TABLET | ORAL | 0 refills | Status: DC

## 2020-01-08 NOTE — Progress Notes (Signed)
Office Visit Note   Patient: Kathryn Miller           Date of Birth: 08/20/1965           MRN: [id number] Visit Date: 01/08/2020 Requested by: Avanell Shackleton, NP-C [address] [place],  [place] [address] PCP: Avanell Shackleton, NP-C  Subjective: Chief Complaint  Patient presents with  . Lower Back - Pain    The SI joint injection at the 10/29/19 lasted only 2 days. Pain returned on the right, but she is now having pain on the left, as well.    HPI: She is here for follow-up low back and right posterior hip pain.  Unfortunately the gluteus medius injection we gave her in October did not give much relief at all.  She continued to have pain and was given a steroid Dosepak which gave her good relief while taking it.  She is in such severe pain that she can hardly move from one position to the next.  Pain does not radiate down the legs, no bowel or bladder dysfunction.  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               ROS:   All other systems were reviewed and are negative.  Objective: Vital Signs: There were no vitals taken for this visit.  Physical Exam:  General:  Alert and oriented, in no acute distress. Pulm:  Breathing unlabored. Psy:  Normal mood, congruent affect.  Low back: She has exaggerated lumbar lordosis.  She is very tender near the right L5-S1 area and SI joint.  She has a little bit of pain on the left as well.  Negative straight leg raise, no pain with internal hip rotation.  Lower extremity strength and reflexes remain normal.  Imaging: No results found.  Assessment & Plan: 1.  Persistent right-sided low back pain, possibly due to facet arthropathy versus SI joint dysfunction. -We will draw some labs to look for rheumatologic issues.  We will order MRI scan lumbar spine.  Prednisone taper given. -Depending on test results, could contemplate referral for facet or SI joint  injection.     Procedures: No procedures performed        PMFS History: Patient Active Problem List   Diagnosis Date Noted  . Chronic right-sided low back pain without sciatica 09/17/2019  . Morbid obesity (HCC) 04/08/2016  . Screen for colon cancer 04/08/2016  . HYPERTHYROIDISM 07/16/2009  . CHALAZION 07/15/2009  . SYSTOLIC MURMUR 07/15/2009  . METRORRHAGIA 10/07/2008  . BACTERIAL VAGINITIS 02/28/2008  . HOT FLASHES 02/28/2008  . Pain in limb 08/30/2007  . DENTAL CARIES 08/08/2007  . ALLERGIC RHINITIS 06/02/2007  . OTITIS MEDIA, LEFT 03/09/2007  . CARBUNCLE, ARM 03/09/2007  . PLANTAR FASCIITIS 05/21/2006  . ANEMIA-IRON DEFICIENCY 11/16/2005   Past Medical History:  Diagnosis Date  . Anemia   . Chest pain   . Murmur, cardiac     Family History  Problem Relation Age of Onset  . Breast cancer Mother   . Diabetes Mother   . Hypertension Mother     Past Surgical History:  Procedure Laterality Date  . CESAREAN SECTION  [phone]  . TUBAL LIGATION  2004   Social History   Occupational History  . Occupation: unemployed  Tobacco Use  . Smoking status: Never Smoker  . Smokeless tobacco: Never Used  Vaping Use  . Vaping Use: Never used  Substance and Sexual  Activity  . Alcohol use: No  . Drug use: No  . Sexual activity: Not on file

## 2020-01-10 LAB — ANA: Anti Nuclear Antibody (ANA): NEGATIVE

## 2020-01-10 LAB — RHEUMATOID FACTOR: Rheumatoid fact SerPl-aCnc: <14 IU/mL (ref <14)

## 2020-01-10 LAB — SEDIMENTATION RATE

## 2020-01-10 LAB — CYCLIC CITRUL PEPTIDE ANTIBODY, IGG: Cyclic Citrullin Peptide Ab: <16 UNITS

## 2020-01-10 LAB — C-REACTIVE PROTEIN: CRP: 18.1 mg/L — ABNORMAL HIGH (ref <8.0)

## 2020-01-13 ENCOUNTER — Telehealth: Payer: Self-pay | Admitting: Family Medicine

## 2020-01-13 NOTE — Telephone Encounter (Signed)
Labs were normal except for elevated CRP (inflammation marker).

## 2020-01-14 NOTE — Telephone Encounter (Signed)
I called and advised the patient of the results. Advised her to let us know if the pain does not improve (for referral for ESI).

## 2020-01-25 ENCOUNTER — Ambulatory Visit (HOSPITAL_COMMUNITY): Admission: RE | Admit: 2020-01-25 | Payer: 59 | Source: Ambulatory Visit

## 2020-01-28 ENCOUNTER — Other Ambulatory Visit: Payer: Self-pay | Admitting: Cardiology

## 2020-01-28 DIAGNOSIS — R079 Chest pain, unspecified: Secondary | ICD-10-CM

## 2020-01-30 ENCOUNTER — Telehealth (HOSPITAL_COMMUNITY): Payer: Self-pay | Admitting: *Deleted

## 2020-01-30 ENCOUNTER — Encounter (HOSPITAL_COMMUNITY): Payer: Self-pay | Admitting: *Deleted

## 2020-01-30 NOTE — Telephone Encounter (Signed)
Attempted to leave a message on voicemail in reference to upcoming appointment scheduled for 02/06/20 but voicemail box full and per DPR attempted to contact spouse and no answer. Mychart letter sent with instructions. Cherica Heiden, Adelene Idler

## 2020-01-31 NOTE — Progress Notes (Signed)
Attempted to contact patient to reschedule covid test but unable to leave message because mailbox was full.

## 2020-02-02 ENCOUNTER — Other Ambulatory Visit (HOSPITAL_COMMUNITY): Payer: 59

## 2020-02-04 ENCOUNTER — Inpatient Hospital Stay (HOSPITAL_COMMUNITY): Admission: RE | Admit: 2020-02-04 | Payer: 59 | Source: Ambulatory Visit

## 2020-02-04 ENCOUNTER — Inpatient Hospital Stay (HOSPITAL_COMMUNITY)
Admission: RE | Admit: 2020-02-04 | Discharge: 2020-02-04 | Disposition: A | Discharge status: Home / Self Care | Payer: 59 | Source: Ambulatory Visit

## 2020-02-04 ENCOUNTER — Telehealth: Payer: Self-pay | Admitting: Cardiology

## 2020-02-04 NOTE — Telephone Encounter (Signed)
    Pt would like to r/s her covid test and stress test

## 2020-02-04 NOTE — Telephone Encounter (Signed)
Spoke with pt who requests to reschedule testing scheduled for 1/19. Pt advised will forward to [facility] and someone should contact her to reschedule.  Pt verbalizes understanding and agrees with current plan.

## 2020-02-05 ENCOUNTER — Telehealth: Payer: Self-pay

## 2020-02-05 MED ORDER — PREDNISONE 10 MG PO TABS: ORAL_TABLET | ORAL | 0 refills | Status: DC

## 2020-02-05 NOTE — Telephone Encounter (Signed)
One more Rx sent.  Not good to take too much of it, so should avoid for a while now.

## 2020-02-05 NOTE — Telephone Encounter (Signed)
Pt called and would like a refill on her prednisone

## 2020-02-05 NOTE — Telephone Encounter (Signed)
I called and advised the patient. She voiced understanding. 

## 2020-02-05 NOTE — Telephone Encounter (Signed)
Please advise 

## 2020-02-06 ENCOUNTER — Ambulatory Visit (HOSPITAL_COMMUNITY): Payer: 59

## 2020-02-07 ENCOUNTER — Ambulatory Visit (HOSPITAL_COMMUNITY): Payer: 59

## 2020-02-13 ENCOUNTER — Ambulatory Visit (HOSPITAL_COMMUNITY)
Admission: RE | Admit: 2020-02-13 | Discharge: 2020-02-13 | Disposition: A | Discharge status: Home / Self Care | Payer: 59 | Source: Ambulatory Visit | Attending: Family Medicine | Admitting: Family Medicine

## 2020-02-13 ENCOUNTER — Other Ambulatory Visit: Payer: Self-pay

## 2020-02-13 DIAGNOSIS — M545 Low back pain, unspecified: Secondary | ICD-10-CM | POA: Insufficient documentation

## 2020-02-14 ENCOUNTER — Telehealth: Payer: Self-pay | Admitting: Family Medicine

## 2020-02-14 DIAGNOSIS — M545 Low back pain, unspecified: Secondary | ICD-10-CM

## 2020-02-14 NOTE — Telephone Encounter (Signed)
Lumbar MRI scan shows small left-sided disc protrusions at L4-5 and L5-S1 with no nerve impingement.  There are no right-sided disc protrusions to explain the current pain.  The most notable finding regarding the pain is arthritis in the facet joints, particularly at L4-5 and L5-S1.  This could very well be the source of the pain.

## 2020-02-15 NOTE — Addendum Note (Signed)
Addended by: Lillia Carmel on: 02/15/2020 07:59 AM   Modules accepted: Orders

## 2020-02-16 ENCOUNTER — Other Ambulatory Visit (HOSPITAL_COMMUNITY): Payer: 59

## 2020-02-18 ENCOUNTER — Telehealth: Payer: Self-pay | Admitting: Physical Medicine and Rehabilitation

## 2020-02-18 NOTE — Telephone Encounter (Signed)
Called pt b/c and sch 2/16

## 2020-02-18 NOTE — Telephone Encounter (Signed)
Pt called returning a missed call to set an appt; she states she will be available for the rest of the day.   [phone]

## 2020-02-19 ENCOUNTER — Ambulatory Visit (INDEPENDENT_AMBULATORY_CARE_PROVIDER_SITE_OTHER): Payer: 59 | Admitting: Family Medicine

## 2020-02-19 ENCOUNTER — Encounter: Payer: Self-pay | Admitting: Family Medicine

## 2020-02-19 ENCOUNTER — Other Ambulatory Visit: Payer: Self-pay

## 2020-02-19 DIAGNOSIS — M47816 Spondylosis without myelopathy or radiculopathy, lumbar region: Secondary | ICD-10-CM | POA: Diagnosis not present

## 2020-02-19 MED ORDER — BACLOFEN 10 MG PO TABS: 5.0000 mg | ORAL_TABLET | Freq: Three times a day (TID) | ORAL | 3 refills | Status: DC | PRN

## 2020-02-19 NOTE — Progress Notes (Signed)
   Office Visit Note   Patient: Kathryn Miller           Date of Birth: December 08, 1965           MRN: [id number] Visit Date: 02/19/2020 Requested by: Avanell Shackleton, NP-C [address] [place],  [place] [address] PCP: Avanell Shackleton, NP-C  Subjective: Chief Complaint  Patient presents with  . Lower Back - Pain    MRI review. No change in symptoms. Some days are better than others.    HPI: She is here for follow-up chronic low back pain.  She is here to discuss MRI results.  She is scheduled to meet with Dr. Alvester Morin in a couple weeks.  She is concerned that the injection might be only a short-term solution, and wonders whether she should consider surgery.                ROS:   All other systems were reviewed and are negative.  Objective: Vital Signs: There were no vitals taken for this visit.  Physical Exam:  General:  Alert and oriented, in no acute distress. Pulm:  Breathing unlabored. Psy:  Normal mood, congruent affect    Imaging: Lumbar MRI reviewed showing advanced facet arthropathy at L4-5 and L5-S1.  No significant nerve impingement.    Assessment & Plan: 1.  Chronic low back pain most likely due to facet arthropathy -We had a lengthy discussion about diagnosis and treatment.  I think surgery should be last resort for her.  I think she will respond well to facet injections.  If it only gives short-term relief, could contemplate radiofrequency neurotomy.  She agrees with this plan.  Baclofen take as needed.     Procedures: No procedures performed        PMFS History: Patient Active Problem List   Diagnosis Date Noted  . Chronic right-sided low back pain without sciatica 09/17/2019  . Morbid obesity (HCC) 04/08/2016  . Screen for colon cancer 04/08/2016  . HYPERTHYROIDISM 07/16/2009  . CHALAZION 07/15/2009  . SYSTOLIC MURMUR 07/15/2009  . METRORRHAGIA 10/07/2008  . BACTERIAL VAGINITIS 02/28/2008  . HOT FLASHES 02/28/2008  . Pain in limb 08/30/2007   . DENTAL CARIES 08/08/2007  . ALLERGIC RHINITIS 06/02/2007  . OTITIS MEDIA, LEFT 03/09/2007  . CARBUNCLE, ARM 03/09/2007  . PLANTAR FASCIITIS 05/21/2006  . ANEMIA-IRON DEFICIENCY 11/16/2005   Past Medical History:  Diagnosis Date  . Anemia   . Chest pain   . Murmur, cardiac     Family History  Problem Relation Age of Onset  . Breast cancer Mother   . Diabetes Mother   . Hypertension Mother     Past Surgical History:  Procedure Laterality Date  . CESAREAN SECTION  [phone]  . TUBAL LIGATION  2004   Social History   Occupational History  . Occupation: unemployed  Tobacco Use  . Smoking status: Never Smoker  . Smokeless tobacco: Never Used  Vaping Use  . Vaping Use: Never used  Substance and Sexual Activity  . Alcohol use: No  . Drug use: No  . Sexual activity: Not on file

## 2020-02-20 ENCOUNTER — Ambulatory Visit (HOSPITAL_COMMUNITY): Payer: 59

## 2020-02-21 ENCOUNTER — Ambulatory Visit (HOSPITAL_COMMUNITY): Payer: 59

## 2020-02-21 ENCOUNTER — Telehealth (HOSPITAL_COMMUNITY): Payer: Self-pay | Admitting: *Deleted

## 2020-02-21 NOTE — Telephone Encounter (Signed)
Close encounter 

## 2020-02-22 ENCOUNTER — Other Ambulatory Visit (HOSPITAL_COMMUNITY)
Admission: RE | Admit: 2020-02-22 | Discharge: 2020-02-22 | Disposition: A | Discharge status: Home / Self Care | Payer: 59 | Source: Ambulatory Visit | Attending: Cardiovascular Disease | Admitting: Cardiovascular Disease

## 2020-02-22 DIAGNOSIS — Z01812 Encounter for preprocedural laboratory examination: Secondary | ICD-10-CM | POA: Insufficient documentation

## 2020-02-22 DIAGNOSIS — Z20822 Contact with and (suspected) exposure to covid-19: Secondary | ICD-10-CM | POA: Diagnosis not present

## 2020-02-22 LAB — SARS CORONAVIRUS 2 (TAT 6-24 HRS): SARS Coronavirus 2: NEGATIVE

## 2020-02-26 ENCOUNTER — Other Ambulatory Visit: Payer: Self-pay

## 2020-02-26 ENCOUNTER — Ambulatory Visit (HOSPITAL_COMMUNITY)
Admission: RE | Admit: 2020-02-26 | Discharge: 2020-02-26 | Disposition: A | Discharge status: Home / Self Care | Payer: 59 | Source: Ambulatory Visit | Attending: Cardiovascular Disease | Admitting: Cardiovascular Disease

## 2020-02-26 DIAGNOSIS — R072 Precordial pain: Secondary | ICD-10-CM | POA: Diagnosis present

## 2020-02-26 MED ORDER — TECHNETIUM TC 99M TETROFOSMIN IV KIT
27.4000 | PACK | Freq: Once | INTRAVENOUS | Status: AC | PRN
  Administered 2020-02-26: 27.4 via INTRAVENOUS
  Filled 2020-02-26: qty 28

## 2020-02-27 ENCOUNTER — Ambulatory Visit (HOSPITAL_COMMUNITY)
Admission: RE | Admit: 2020-02-27 | Discharge: 2020-02-27 | Disposition: A | Discharge status: Home / Self Care | Payer: 59 | Source: Ambulatory Visit | Attending: Cardiovascular Disease | Admitting: Cardiovascular Disease

## 2020-02-27 LAB — MYOCARDIAL PERFUSION IMAGING
Estimated workload: 7.6 METS
Exercise duration (min): 7 min
Exercise duration (sec): 31 s
LV dias vol: 114 mL (ref 46–106)
LV sys vol: 53 mL
MPHR: 165 {beats}/min
Peak HR: 146 {beats}/min
Percent HR: 88 %
Rest HR: 64 {beats}/min
SDS: 2
SRS: 3
SSS: 5
TID: 0.91

## 2020-02-27 MED ORDER — TECHNETIUM TC 99M TETROFOSMIN IV KIT
31.9000 | PACK | Freq: Once | INTRAVENOUS | Status: AC | PRN
  Administered 2020-02-27: 31.9 via INTRAVENOUS

## 2020-03-03 ENCOUNTER — Telehealth: Payer: Self-pay

## 2020-03-03 NOTE — Telephone Encounter (Signed)
Patient called she is receiving a injection Wednesday at 2:00 she wants to know if she will be able to got to work after receiving the injection she works 3rd shift call back:[phone]

## 2020-03-03 NOTE — Telephone Encounter (Signed)
Left message to advise that she cannot drive for 3 hours after the injection, but after that she cn return to work depending on how she feels.

## 2020-03-05 ENCOUNTER — Ambulatory Visit (INDEPENDENT_AMBULATORY_CARE_PROVIDER_SITE_OTHER): Payer: 59 | Admitting: Physical Medicine and Rehabilitation

## 2020-03-05 ENCOUNTER — Encounter: Payer: Self-pay | Admitting: Physical Medicine and Rehabilitation

## 2020-03-05 ENCOUNTER — Other Ambulatory Visit: Payer: Self-pay

## 2020-03-05 ENCOUNTER — Ambulatory Visit: Payer: Self-pay

## 2020-03-05 VITALS — BP 121/76

## 2020-03-05 DIAGNOSIS — M47816 Spondylosis without myelopathy or radiculopathy, lumbar region: Secondary | ICD-10-CM | POA: Diagnosis not present

## 2020-03-05 MED ORDER — METHYLPREDNISOLONE ACETATE 80 MG/ML IJ SUSP
80.0000 mg | Freq: Once | INTRAMUSCULAR | Status: AC
Start: 2020-03-05 — End: 2020-03-05
  Administered 2020-03-05: 80 mg

## 2020-03-05 NOTE — Progress Notes (Signed)
LORRI FUKUHARA - 55 y.o. female MRN [id number]  Date of birth: [birth year]  Office Visit Note: Visit Date: 03/05/2020 PCP: Avanell Shackleton, NP-C Referred by: Avanell Shackleton, NP-C  Subjective: No chief complaint on file.  HPI:  Kathryn Miller is a 55 y.o. female who comes in today at the request of Dr. Lavada Mesi for planned Right  L5-S1 Lumbar facet/medial branch block with fluoroscopic guidance.  The patient has failed conservative care including home exercise, medications, time and activity modification.  This injection will be diagnostic and hopefully therapeutic.  Please see requesting physician notes for further details and justification.  Exam has shown concordant pain with facet joint loading.   ROS Otherwise per HPI.  Assessment & Plan: Visit Diagnoses:    ICD-10-CM   1. Spondylosis without myelopathy or radiculopathy, lumbar region  M47.816 XR C-ARM NO REPORT    Facet Injection    methylPREDNISolone acetate (DEPO-MEDROL) injection 80 mg    Plan: No additional findings.   Meds & Orders:  Meds ordered this encounter  Medications  . methylPREDNISolone acetate (DEPO-MEDROL) injection 80 mg    Orders Placed This Encounter  Procedures  . Facet Injection  . XR C-ARM NO REPORT    Follow-up: Return if symptoms worsen or fail to improve.   Procedures: No procedures performed  Lumbar Facet Joint Intra-Articular Injection(s) with Fluoroscopic Guidance  Patient: Kathryn Miller      Date of Birth: 1965-05-09 MRN: [id number] PCP: Avanell Shackleton, NP-C      Visit Date: 03/05/2020   Universal Protocol:    Date/Time: 03/05/2020  Consent Given By: the patient  Position: PRONE   Additional Comments: Vital signs were monitored before and after the procedure. Patient was prepped and draped in the usual sterile fashion. The correct patient, procedure, and site was verified.   Injection Procedure Details:  Procedure Site One Meds Administered:  Meds ordered  this encounter  Medications  . methylPREDNISolone acetate (DEPO-MEDROL) injection 80 mg     Laterality: Right  Location/Site:  L5-S1  Needle size: 22 guage  Needle type: Spinal  Needle Placement: Articular  Findings:  -Comments: Excellent flow of contrast producing a partial arthrogram.  Procedure Details: The fluoroscope beam is vertically oriented in AP, and the inferior recess is visualized beneath the lower pole of the inferior apophyseal process, which represents the target point for needle insertion. When direct visualization is difficult the target point is located at the medial projection of the vertebral pedicle. The region overlying each aforementioned target is locally anesthetized with a 1 to 2 ml. volume of 1% Lidocaine without Epinephrine.   The spinal needle was inserted into each of the above mentioned facet joints using biplanar fluoroscopic guidance. A 0.25 to 0.5 ml. volume of Isovue-250 was injected and a partial facet joint arthrogram was obtained. A single spot film was obtained of the resulting arthrogram.    One to 1.25 ml of the steroid/anesthetic solution was then injected into each of the facet joints noted above.   Additional Comments:  The patient tolerated the procedure well Dressing: 2 x 2 sterile gauze and Band-Aid    Post-procedure details: Patient was observed during the procedure. Post-procedure instructions were reviewed.  Patient left the clinic in stable condition.     Clinical History: MRI LUMBAR SPINE WITHOUT CONTRAST  TECHNIQUE: Multiplanar, multisequence MR imaging of the lumbar spine was performed. No intravenous contrast was administered.  COMPARISON:  Plain films October 19, 2019  FINDINGS:  Segmentation:  Standard.  Alignment:  Trace anterolisthesis of L4 over L5.  Vertebrae:  No fracture, evidence of discitis, or bone lesion.  Conus medullaris and cauda equina: Conus extends to the L1-2 level. Conus and cauda  equina appear normal.  Paraspinal and other soft tissues: Negative.  Disc levels:  T12-L1: No spinal canal or neural foraminal stenosis.  L1-2: No spinal canal or neural foraminal stenosis.  L2-3: No spinal canal or neural foraminal stenosis.  L3-4: Shallow disc bulge and mild facet degenerative changes without significant spinal canal or neural foraminal stenosis.  L4-5: Disc bulge with superimposed tiny left far lateral disc protrusion, advanced facet degenerative changes and ligamentum flavum redundancy without significant spinal canal or neural foraminal stenosis.  L5-S1: Small left foraminal/far lateral disc protrusion, advanced right and moderate left facet degenerative changes. Findings result in mild left neural foraminal narrowing. No spinal canal stenosis.  IMPRESSION: 1. Advanced degenerative changes of the facet joints at L4-L5 and L5-S1, more pronounced on the right at L5-S1. 2. Mild left neural foraminal narrowing at L5-S1. 3. No significant spinal canal stenosis or evidence of nerve impingement.   Electronically Signed   By: Baldemar Lenis M.D.   On: 02/13/2020 16:55     Objective:  VS:  HT:    WT:   BMI:     BP:121/76  HR: bpm  TEMP: ( )  RESP:97 % Physical Exam Vitals and nursing note reviewed.  Constitutional:      General: She is not in acute distress.    Appearance: Normal appearance. She is obese. She is not ill-appearing.  HENT:     Head: Normocephalic and atraumatic.     Right Ear: External ear normal.     Left Ear: External ear normal.  Eyes:     Extraocular Movements: Extraocular movements intact.  Cardiovascular:     Rate and Rhythm: Normal rate.     Pulses: Normal pulses.  Pulmonary:     Effort: Pulmonary effort is normal. No respiratory distress.  Abdominal:     General: There is no distension.     Palpations: Abdomen is soft.  Musculoskeletal:        General: Tenderness present.     Cervical back:  Neck supple.     Right lower leg: No edema.     Left lower leg: No edema.     Comments: Patient has good distal strength with no pain over the greater trochanters.  No clonus or focal weakness. Patient somewhat slow to rise from a seated position to full extension.  There is concordant low back pain with facet loading and lumbar spine extension rotation.  There are no definitive trigger points but the patient is somewhat tender across the lower back and PSIS.  There is no pain with hip rotation.   Skin:    Findings: No erythema, lesion or rash.  Neurological:     General: No focal deficit present.     Mental Status: She is alert and oriented to person, place, and time.     Sensory: No sensory deficit.     Motor: No weakness or abnormal muscle tone.     Coordination: Coordination normal.  Psychiatric:        Mood and Affect: Mood normal.        Behavior: Behavior normal.      Imaging: No results found.

## 2020-03-05 NOTE — Progress Notes (Signed)
Numeric Pain Rating Scale and Functional Assessment Average Pain 10   In the last MONTH (on 0-10 scale) has pain interfered with the following?  1. General activity like being  able to carry out your everyday physical activities such as walking, climbing stairs, carrying groceries, or moving a chair?  Rating(10)  Lower back pain with right leg radiculoparhy.Tingles and burns. +Driver, -BT, -Dye Allergies.

## 2020-03-05 NOTE — Patient Instructions (Signed)

## 2020-03-17 ENCOUNTER — Ambulatory Visit (INDEPENDENT_AMBULATORY_CARE_PROVIDER_SITE_OTHER): Payer: 59 | Admitting: Family Medicine

## 2020-03-17 ENCOUNTER — Encounter: Payer: Self-pay | Admitting: Family Medicine

## 2020-03-17 ENCOUNTER — Other Ambulatory Visit: Payer: Self-pay

## 2020-03-17 DIAGNOSIS — M545 Low back pain, unspecified: Secondary | ICD-10-CM

## 2020-03-17 DIAGNOSIS — G8929 Other chronic pain: Secondary | ICD-10-CM | POA: Diagnosis not present

## 2020-03-17 NOTE — Progress Notes (Signed)
   Subjective:    Patient ID: Kathryn Miller, female    DOB: 29-Feb-1965, 55 y.o.   MRN: [id number]  HPI Chief Complaint  Patient presents with  . med check    She is here for a 26-month medication management visit.  Since she was last here, she has been seen by her cardiologist and her orthopedist.  Recent treadmill stress test negative per patient.  She is having an echocardiogram next week.  Recent lumbar spine injection which has helped per patient.  She is still unhappy with her weight and is making more of an effort to eat a healthy diet low in carbohydrates.  No new concerns or symptoms.  Denies fever, chills, dizziness, chest pain, palpitations, shortness of breath, abdominal pain, N/V/D, urinary symptoms, LE edema.     Review of Systems Pertinent positives and negatives in the history of present illness.     Objective:   Physical Exam BP 140/86   Pulse 80   Temp (!) 97.3 F (36.3 C)   Ht 5' 1.75" (1.568 m)   Wt 229 lb (103.9 kg)   SpO2 99%   BMI 42.22 kg/m   Alert and oriented and in no acute distress.  Respirations unlabored.      Assessment & Plan:  Morbid obesity (HCC)  Chronic right-sided low back pain without sciatica  She is here today for follow-up and is in good spirits.  No new concerns.  She is now under the care of a cardiologist and orthopedic specialist.  Follow-up as needed or when she is due for her next fasting CPE

## 2020-03-24 ENCOUNTER — Encounter (HOSPITAL_COMMUNITY): Payer: Self-pay | Admitting: Cardiology

## 2020-03-24 ENCOUNTER — Ambulatory Visit (HOSPITAL_COMMUNITY): Payer: 59 | Attending: Cardiology

## 2020-04-07 ENCOUNTER — Telehealth (HOSPITAL_COMMUNITY): Payer: Self-pay | Admitting: Cardiology

## 2020-04-07 NOTE — Telephone Encounter (Signed)
Just an FYI. We have made several attempts to contact this patient including sending a letter to schedule or reschedule their echocardiogram. We will be removing the patient from the echo WQ.   03/24/20 NO SHOWED -MAILED LETTER [facility]  11/29/19 Spoke with pt and she will call us back to reschedule @ 9:50/[facility]  11/26/19 Called and VM is ful and unable to LVM @ 10:30/[facility]  10/23/19 [facility] @ 9:34 to call back to reschedule/[facility]    Thank you

## 2020-04-21 NOTE — Procedures (Signed)
Lumbar Facet Joint Intra-Articular Injection(s) with Fluoroscopic Guidance  Patient: Kathryn Miller      Date of Birth: May 05, 1965 MRN: [id number] PCP: Avanell Shackleton, NP-C      Visit Date: 03/05/2020   Universal Protocol:    Date/Time: 03/05/2020  Consent Given By: the patient  Position: PRONE   Additional Comments: Vital signs were monitored before and after the procedure. Patient was prepped and draped in the usual sterile fashion. The correct patient, procedure, and site was verified.   Injection Procedure Details:  Procedure Site One Meds Administered:  Meds ordered this encounter  Medications  . methylPREDNISolone acetate (DEPO-MEDROL) injection 80 mg     Laterality: Right  Location/Site:  L5-S1  Needle size: 22 guage  Needle type: Spinal  Needle Placement: Articular  Findings:  -Comments: Excellent flow of contrast producing a partial arthrogram.  Procedure Details: The fluoroscope beam is vertically oriented in AP, and the inferior recess is visualized beneath the lower pole of the inferior apophyseal process, which represents the target point for needle insertion. When direct visualization is difficult the target point is located at the medial projection of the vertebral pedicle. The region overlying each aforementioned target is locally anesthetized with a 1 to 2 ml. volume of 1% Lidocaine without Epinephrine.   The spinal needle was inserted into each of the above mentioned facet joints using biplanar fluoroscopic guidance. A 0.25 to 0.5 ml. volume of Isovue-250 was injected and a partial facet joint arthrogram was obtained. A single spot film was obtained of the resulting arthrogram.    One to 1.25 ml of the steroid/anesthetic solution was then injected into each of the facet joints noted above.   Additional Comments:  The patient tolerated the procedure well Dressing: 2 x 2 sterile gauze and Band-Aid    Post-procedure details: Patient was observed  during the procedure. Post-procedure instructions were reviewed.  Patient left the clinic in stable condition.

## 2020-06-10 ENCOUNTER — Telehealth: Payer: Self-pay | Admitting: Family Medicine

## 2020-06-10 DIAGNOSIS — Z111 Encounter for screening for respiratory tuberculosis: Secondary | ICD-10-CM

## 2020-06-10 NOTE — Telephone Encounter (Signed)
Please assist her with this  

## 2020-06-10 NOTE — Telephone Encounter (Signed)
Pt called and states that she needs a up to date TB test for work She can be reached at [phone]

## 2020-06-25 ENCOUNTER — Other Ambulatory Visit: Payer: Self-pay

## 2020-06-25 ENCOUNTER — Other Ambulatory Visit: Payer: 59

## 2020-06-25 DIAGNOSIS — Z111 Encounter for screening for respiratory tuberculosis: Secondary | ICD-10-CM

## 2020-06-26 ENCOUNTER — Ambulatory Visit: Payer: 59 | Admitting: Family Medicine

## 2020-06-28 LAB — QUANTIFERON-TB GOLD PLUS
QuantiFERON Mitogen Value: >10 IU/mL
QuantiFERON Nil Value: 0.69 IU/mL
QuantiFERON TB1 Ag Value: 0.51 IU/mL
QuantiFERON TB2 Ag Value: 0.76 IU/mL
QuantiFERON-TB Gold Plus: NEGATIVE

## 2020-06-29 NOTE — Progress Notes (Signed)
Negative TB blood test

## 2020-06-30 ENCOUNTER — Ambulatory Visit: Payer: 59 | Admitting: Family Medicine

## 2020-06-30 ENCOUNTER — Telehealth (INDEPENDENT_AMBULATORY_CARE_PROVIDER_SITE_OTHER): Payer: 59 | Admitting: Family Medicine

## 2020-06-30 ENCOUNTER — Encounter: Payer: Self-pay | Admitting: Family Medicine

## 2020-06-30 VITALS — BP 138/75

## 2020-06-30 DIAGNOSIS — G8929 Other chronic pain: Secondary | ICD-10-CM

## 2020-06-30 DIAGNOSIS — R519 Headache, unspecified: Secondary | ICD-10-CM

## 2020-06-30 DIAGNOSIS — J3489 Other specified disorders of nose and nasal sinuses: Secondary | ICD-10-CM | POA: Diagnosis not present

## 2020-06-30 DIAGNOSIS — M545 Low back pain, unspecified: Secondary | ICD-10-CM

## 2020-06-30 DIAGNOSIS — R058 Other specified cough: Secondary | ICD-10-CM

## 2020-06-30 DIAGNOSIS — M542 Cervicalgia: Secondary | ICD-10-CM | POA: Diagnosis not present

## 2020-06-30 NOTE — Progress Notes (Signed)
   Subjective:  Documentation for virtual audio and video telecommunications through Caregility encounter:  Patient unable to sign in through MyChart or through virtual text.  The call had to be completed using telephone. The patient was located at home. 2 patient identifiers used.  The provider was located in the office. The patient did consent to this visit and is aware of possible charges through their insurance for this visit.  The other persons participating in this telemedicine service were none. Time spent on call was 17 minutes and in review of previous records >20 minutes total.  This virtual service is not related to other E/M service within previous 7 days.   Patient ID: Kathryn Miller, female    DOB: 03-04-1965, 55 y.o.   MRN: [id number]  HPI Chief Complaint  Patient presents with   cough, headache and runny nose    HA for 3 days. Runny nose started this morning, cough- here and there 2 days   Complains of a 3 day history of headache. States dry cough started yesterday and rhinorrhea started this morning.   States she has taken Tylenol and Motrin which helped today. States she took an allergy pill and that did not help.   States she was awake all night last night with a headache.   Covid vaccines- 2 Moderna and booster.   Covid testing - negative.   Denies fever, chills, dizziness, chest pain, palpitations, shortness of breath, abdominal pain, N/V/D, urinary symptoms, LE edema.   States she has neck popping and shoulder pain when turning her head to the right or laying on her right shoulder. No known injury. Started all of a sudden the end of May. 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 Pain occasionally radiates down her right side to her fingers. No numbness, tingling or weakness.  Using ice, heat, and Biofreeze.  States she had a nerve issue with a muscle pull while working.   She has seen Dr. Prince Rome and Dr. Alvester [facility]in [facility] for her back pain which seems to be worsening again.  States she did have some relief after they injected her back.    Review of Systems Pertinent positives and negatives in the history of present illness.     Objective:   Physical Exam BP 138/75   Alert and oriented and in no acute distress.  Speaking in complete sentences without difficulty.  Normal speech, mood and thought process.      Assessment & Plan:  Acute nonintractable headache, unspecified headache type  Rhinorrhea  Dry cough  Neck pain on right side  Chronic right-sided low back pain without sciatica  Reports negative COVID test at home. No red flag symptoms Headache improving. Discussed symptomatic treatment including Tylenol or ibuprofen, good hydration, rest, Mucinex DM or Robitussin-DM if needed. She will follow-up with her orthopedist since her back pain is returning and to discuss neck pain

## 2020-07-02 ENCOUNTER — Encounter: Payer: Self-pay | Admitting: Physical Medicine and Rehabilitation

## 2020-07-02 ENCOUNTER — Encounter: Payer: Self-pay | Admitting: Family Medicine

## 2020-07-16 ENCOUNTER — Encounter: Payer: Self-pay | Admitting: Physical Medicine and Rehabilitation

## 2020-07-16 ENCOUNTER — Other Ambulatory Visit: Payer: Self-pay

## 2020-07-16 ENCOUNTER — Ambulatory Visit (INDEPENDENT_AMBULATORY_CARE_PROVIDER_SITE_OTHER): Payer: 59 | Admitting: Physical Medicine and Rehabilitation

## 2020-07-16 ENCOUNTER — Telehealth: Payer: Self-pay | Admitting: Physical Medicine and Rehabilitation

## 2020-07-16 VITALS — BP 112/77 | HR 64

## 2020-07-16 DIAGNOSIS — M542 Cervicalgia: Secondary | ICD-10-CM

## 2020-07-16 DIAGNOSIS — M47816 Spondylosis without myelopathy or radiculopathy, lumbar region: Secondary | ICD-10-CM | POA: Diagnosis not present

## 2020-07-16 DIAGNOSIS — M47899 Other spondylosis, site unspecified: Secondary | ICD-10-CM

## 2020-07-16 DIAGNOSIS — G894 Chronic pain syndrome: Secondary | ICD-10-CM

## 2020-07-16 DIAGNOSIS — M25512 Pain in left shoulder: Secondary | ICD-10-CM | POA: Diagnosis not present

## 2020-07-16 DIAGNOSIS — G8929 Other chronic pain: Secondary | ICD-10-CM

## 2020-07-16 MED ORDER — BACLOFEN 10 MG PO TABS: 5.0000 mg | ORAL_TABLET | Freq: Three times a day (TID) | ORAL | 3 refills | Status: DC | PRN

## 2020-07-16 NOTE — Progress Notes (Signed)
Kathryn Miller - 55 y.o. female MRN [id number]  Date of birth: May 06, 1965  Office Visit Note: Visit Date: 07/16/2020 PCP: Avanell Shackleton, NP-C Referred by: Avanell Shackleton, NP-C  Subjective: Chief Complaint  Patient presents with   Lower Back - Pain   Neck - Pain   HPI: Kathryn Miller is a 55 y.o. female who comes in today for evaluation of chronic, worsening, and severe right-sided lower back pain that radiates to buttock.  She is followed in our office by Dr. Lavada Mesi.  His notes fully reviewed.  Patient reports pain has been chronic for several years and worsens with standing, bending and activity. Patient rates pain at 8 out of 10 at present. Patient reports stretching exercises home exercises from prior physical therapy and use of Baclofen at gives her some pain relief.  She has had trial of nonsteroidal anti-inflammatories and oral prednisone in the past.  She had local injection by Dr. Prince Rome.  Patient had right L5-S1 intra-articular lumbar facet injection in February of 2022 which gave her 100% pain relief for 1 month. Patient states pain was exacerbated shortly after the month of sustained relief when caring for her mother. Patient's recent lumbar MRI reveals multilevel advanced degenerative changes of the facet joints, more severe to right L5-S1. Patient denies focal weakness, numbness, and tingling. Patient denies any recent trauma or falls.  Patient also reports chronic, worsening, and severe neck and left shoulder pain. Patient reports history of left shoulder injury in 2017 caused by lifting.  Patient reports neck and left shoulder pain has been chronic since injury however pain has become excruciating in the last month.  Patient reports her biggest complaint is the shoulder with shoulder pain that increases with lifting and reaching. Patient also reports severe pain to left shoulder when laying and sleeping on left side. Patient rates pain as 10 out of 10 and describes as a  pressure sensation. Patient reports some pain relief with the use of ibuprofen at home.  This same problem was being treated by Dr. Gaspar Bidding in 2017.  His notes can be reviewed.  At that time I actually completed a C7-T1 interlaminar injection without any relief.  Dr. Berline Chough plan for her to have a MR arthrogram of left shoulder and follow-up with Dr. Cammy Copa.  Back consultation and imaging was not performed. Patient denies focal weakness, numbness and tingling.   Review of Systems  Musculoskeletal:  Positive for back pain, joint pain and neck pain.  Neurological:  Negative for tingling, sensory change and focal weakness.  All other systems reviewed and are negative. Otherwise per HPI.  Assessment & Plan: Visit Diagnoses:    ICD-10-CM   1. Spondylosis without myelopathy or radiculopathy, lumbar region  M47.816     2. Facet joint syndrome  M47.899     3. Chronic left shoulder pain  M25.512 Ambulatory referral to Orthopedic Surgery   G89.29     4. Cervicalgia  M54.2     5. Chronic pain syndrome  G89.4        Plan: Findings:  Chronic, worsening, and severe right-sided lower back pain radiating to buttock. Patients clinical picture and imaging consistent with classic facet joint pattern.  Imaging is really consistent mainly for facet arthropathy without stenosis or nerve compression.  Patient continues to have excruciating pain that is affecting her activities of daily living. Plan of care discussed with patient today, she will continue taking Baclofen at home for pain relief  and is also encouraged to continue stretching exercises. The neck step is to perform a diagnostic and hopefully therapeutic right L5-S1 facet joint/medial branch block with fluoroscopic guidance as part of part of a double block paradigm with goal towards radiofrequency ablation for more sustained treatment. Radiofrequency Ablation was also discussed with patient today and potentially could be a good option for  prolonged sustained pain relief if she has success with the facet injections.   Patient continues to have chronic, worsening and severe neck and anterolateral left shoulder pain. Patient has significant pain noted with abduction of the left arm and left shoulder impingement is noted upon exam with internal and external rotation. We feel patient would be better served by following up with orthopedic specialist for further treatment of her chronic left shoulder pain.  Referral placed with Dr. Cammy Copa.   Meds & Orders:  Meds ordered this encounter  Medications   baclofen (LIORESAL) 10 MG tablet    Sig: Take 0.5-1 tablets (5-10 mg total) by mouth 3 (three) times daily as needed for muscle spasms.    Dispense:  30 each    Refill:  3    Order Specific Question:   Supervising Provider    Answer:   Tyrell Antonio [[id number]]     Orders Placed This Encounter  Procedures   Ambulatory referral to Orthopedic Surgery    Follow-up: Return in about 1 week (around 07/23/2020) for right L5-S1 intra-articular lumbar facet ESI.   Procedures: No procedures performed      Clinical History: MRI LUMBAR SPINE WITHOUT CONTRAST   TECHNIQUE: Multiplanar, multisequence MR imaging of the lumbar spine was performed. No intravenous contrast was administered.   COMPARISON:  Plain films October 19, 2019   FINDINGS: Segmentation:  Standard.   Alignment:  Trace anterolisthesis of L4 over L5.   Vertebrae:  No fracture, evidence of discitis, or bone lesion.   Conus medullaris and cauda equina: Conus extends to the L1-2 level. Conus and cauda equina appear normal.   Paraspinal and other soft tissues: Negative.   Disc levels:   T12-L1: No spinal canal or neural foraminal stenosis.   L1-2: No spinal canal or neural foraminal stenosis.   L2-3: No spinal canal or neural foraminal stenosis.   L3-4: Shallow disc bulge and mild facet degenerative changes without significant spinal canal or neural  foraminal stenosis.   L4-5: Disc bulge with superimposed tiny left far lateral disc protrusion, advanced facet degenerative changes and ligamentum flavum redundancy without significant spinal canal or neural foraminal stenosis.   L5-S1: Small left foraminal/far lateral disc protrusion, advanced right and moderate left facet degenerative changes. Findings result in mild left neural foraminal narrowing. No spinal canal stenosis.   IMPRESSION: 1. Advanced degenerative changes of the facet joints at L4-L5 and L5-S1, more pronounced on the right at L5-S1. 2. Mild left neural foraminal narrowing at L5-S1. 3. No significant spinal canal stenosis or evidence of nerve impingement.     Electronically Signed   By: Baldemar Lenis M.D.   On: 02/13/2020 16:55   She reports that she has never smoked. She has never used smokeless tobacco. No results for input(s): HGBA1C, LABURIC in the last 8760 hours.  Objective:  VS:  HT:    WT:   BMI:     BP:112/77  HR:64bpm  TEMP: ( )  RESP:  Physical Exam HENT:     Head: Normocephalic.     Right Ear: Tympanic membrane normal.  Left Ear: Tympanic membrane normal.     Mouth/Throat:     Mouth: Mucous membranes are moist.  Eyes:     Pupils: Pupils are equal, round, and reactive to light.  Neck:     Comments: Discomfort noted with side-to-side rotation. Good strength noted to bilateral upper extremities. Sensation intact bilaterally. Negative Hoffman's sign.   Hesitant to abduct left arm due to pain, left shoulder impingement noted upon internal and external rotation of left arm.  Cardiovascular:     Rate and Rhythm: Normal rate and regular rhythm.     Pulses: Normal pulses.  Pulmonary:     Effort: Pulmonary effort is normal.  Abdominal:     General: There is no distension.  Musculoskeletal:        General: Tenderness present.     Cervical back: Normal range of motion. Tenderness present.     Comments: Pt is slow to rise from  seated position to standing. Pain noted with facet loading. Strong distal strength without clonus, no pain upon palpation of greater trochanters. Sensation intact bilaterally. Walks independently, gait steady.   Evaluation of the cervical spine cervical spine shows that she said cervical spine shows that she sits with a little bit of a forward flexed cervical spine with tightness in the trapezius.  She has significant pain with external rotation and impingement signs on the left.  She does not want to move the left arm arm secondary to pain in the shoulder.   Skin:    General: Skin is warm and dry.     Findings: No rash.  Neurological:     General: No focal deficit present.     Mental Status: She is alert.     Cranial Nerves: No cranial nerve deficit.     Sensory: No sensory deficit.     Motor: No weakness.  Psychiatric:        Mood and Affect: Mood normal.    Ortho Exam  Imaging: No results found.  Past Medical/Family/Surgical/Social History: Medications & Allergies reviewed per EMR, new medications updated. Patient Active Problem List   Diagnosis Date Noted   Chronic right-sided low back pain without sciatica 09/17/2019   Morbid obesity (HCC) 04/08/2016   Screen for colon cancer 04/08/2016   HYPERTHYROIDISM 07/16/2009   CHALAZION 07/15/2009   SYSTOLIC MURMUR 07/15/2009   METRORRHAGIA 10/07/2008   BACTERIAL VAGINITIS 02/28/2008   HOT FLASHES 02/28/2008   Pain in limb 08/30/2007   DENTAL CARIES 08/08/2007   ALLERGIC RHINITIS 06/02/2007   OTITIS MEDIA, LEFT 03/09/2007   CARBUNCLE, ARM 03/09/2007   PLANTAR FASCIITIS 05/21/2006   ANEMIA-IRON DEFICIENCY 11/16/2005   Past Medical History:  Diagnosis Date   Anemia    Chest pain    Murmur, cardiac    Family History  Problem Relation Age of Onset   Breast cancer Mother    Diabetes Mother    Hypertension Mother    Past Surgical History:  Procedure Laterality Date   CESAREAN SECTION  [phone]   TUBAL LIGATION   2004   Social History   Occupational History   Occupation: unemployed  Tobacco Use   Smoking status: Never   Smokeless tobacco: Never  Vaping Use   Vaping Use: Never used  Substance and Sexual Activity   Alcohol use: No   Drug use: No   Sexual activity: Not on file

## 2020-07-16 NOTE — Telephone Encounter (Signed)
Needs auth and scheduling for right L5-S1 MBB #2.

## 2020-07-16 NOTE — Progress Notes (Signed)
Pain in low back mostly on right side. Pain with lying in bed. States that her pain was better after injections but returned after caring for her mom. Also has neck pain and pain and heavy feeling in left shoulder when turning head. Burning feeling in shoulder and left arm with lying down- sometimes radiates to fingers. Left upper arm is the worst. Injured left arm in 2017. Numeric Pain Rating Scale and Functional Assessment Average Pain 10 Pain Right Now 10 My pain is constant, burning, and aching Pain is worse with: some activites and lying down Pain improves with: medication   In the last MONTH (on 0-10 scale) has pain interfered with the following?  1. General activity like being  able to carry out your everyday physical activities such as walking, climbing stairs, carrying groceries, or moving a chair?  Rating(9)  2. Relation with others like being able to carry out your usual social activities and roles such as  activities at home, at work and in your community. Rating(9)  3. Enjoyment of life such that you have  been bothered by emotional problems such as feeling anxious, depressed or irritable?  Rating(10)

## 2020-07-17 ENCOUNTER — Encounter: Payer: Self-pay | Admitting: Physical Medicine and Rehabilitation

## 2020-07-25 ENCOUNTER — Telehealth: Payer: Self-pay | Admitting: Orthopedic Surgery

## 2020-07-25 NOTE — Telephone Encounter (Signed)
Pt called stating she is in a lot of pain from her back and shoulder. Pt has an appt on 08/06/20 with Dr.Newton and Dr.Dean but she would like to have something called in to last her until then. Pt wants to make sure whatever Dr.Dean decides to send in will be strong enough to help in both areas; pt would also like a CB to be updated on what's being sent in.   [phone]

## 2020-08-06 ENCOUNTER — Ambulatory Visit (INDEPENDENT_AMBULATORY_CARE_PROVIDER_SITE_OTHER): Payer: 59

## 2020-08-06 ENCOUNTER — Encounter: Payer: Self-pay | Admitting: Orthopedic Surgery

## 2020-08-06 ENCOUNTER — Ambulatory Visit: Payer: Self-pay

## 2020-08-06 ENCOUNTER — Ambulatory Visit (INDEPENDENT_AMBULATORY_CARE_PROVIDER_SITE_OTHER): Payer: 59 | Admitting: Physical Medicine and Rehabilitation

## 2020-08-06 ENCOUNTER — Ambulatory Visit (INDEPENDENT_AMBULATORY_CARE_PROVIDER_SITE_OTHER): Payer: 59 | Admitting: Orthopedic Surgery

## 2020-08-06 ENCOUNTER — Encounter: Payer: Self-pay | Admitting: Physical Medicine and Rehabilitation

## 2020-08-06 ENCOUNTER — Other Ambulatory Visit: Payer: Self-pay

## 2020-08-06 VITALS — Ht 62.0 in | Wt 219.0 lb

## 2020-08-06 VITALS — BP 114/80 | HR 58

## 2020-08-06 DIAGNOSIS — M25512 Pain in left shoulder: Secondary | ICD-10-CM

## 2020-08-06 DIAGNOSIS — G8929 Other chronic pain: Secondary | ICD-10-CM

## 2020-08-06 DIAGNOSIS — M47816 Spondylosis without myelopathy or radiculopathy, lumbar region: Secondary | ICD-10-CM

## 2020-08-06 DIAGNOSIS — M542 Cervicalgia: Secondary | ICD-10-CM

## 2020-08-06 MED ORDER — METHYLPREDNISOLONE 4 MG PO TBPK: ORAL_TABLET | ORAL | 0 refills | Status: DC

## 2020-08-06 MED ORDER — BUPIVACAINE HCL 0.5 % IJ SOLN
3.0000 mL | Freq: Once | INTRAMUSCULAR | Status: AC
Start: 2020-08-06 — End: 2020-08-06
  Administered 2020-08-06: 3 mL

## 2020-08-06 MED ORDER — BETAMETHASONE SOD PHOS & ACET 6 (3-3) MG/ML IJ SUSP
12.0000 mg | Freq: Once | INTRAMUSCULAR | Status: AC
Start: 2020-08-06 — End: 2020-08-06
  Administered 2020-08-06: 12 mg

## 2020-08-06 MED ORDER — CYCLOBENZAPRINE HCL 5 MG PO TABS: 5.0000 mg | ORAL_TABLET | Freq: Three times a day (TID) | ORAL | 0 refills | Status: DC | PRN

## 2020-08-06 NOTE — Progress Notes (Signed)
Pt state lower back pain that travels a little down her right leg. Pt state walking and laying down makes the pain worse. Pt state she takes over the counter pain meds.  Pt has hx of inj on 03/05/20 pt state it helped.  Numeric Pain Rating Scale and Functional Assessment Average Pain 8   In the last MONTH (on 0-10 scale) has pain interfered with the following?  1. General activity like being  able to carry out your everyday physical activities such as walking, climbing stairs, carrying groceries, or moving a chair?  Rating(10)   +Driver, -BT, -Dye Allergies.

## 2020-08-06 NOTE — Patient Instructions (Signed)

## 2020-08-11 ENCOUNTER — Encounter: Payer: Self-pay | Admitting: Orthopedic Surgery

## 2020-08-11 NOTE — Progress Notes (Signed)
Office Visit Note   Patient: [name]           Date of Birth: 06/30/1965           MRN: [id number] Visit Date: 08/06/2020 Requested by: Juanda Chance, NP [address],  [place] [address] PCP: Avanell Shackleton, NP-C  Subjective: Chief Complaint  Patient presents with   Left Shoulder - Pain   Neck - Pain    HPI: Kathryn Miller is a 55 y.o. female who presents to the office complaining of left shoulder pain.  Patient notes pain for several years but pain has been worsening over the last several months.  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  She recently was lifting her mother and flared up her shoulder and associated neck pain.  Localizes most of her pain to the axial neck as well as the left trapezius with some axillary pain as well.  Describes a burning pain with tingling that travels down from her neck into her fingertips as well as muscle spasms through her bicep.  She has occasional right-sided symptoms but not to the extent that she does on the left.  She is right-hand dominant.  She has pain with turning her head to her left as well that she localizes to the neck primarily.  She states her left arm "feels heavy".  No history of neck or shoulder surgery.  She has not been dropping things and denies any change in her gait.  Motrin has helped.  Notes about the only thing that helps and she has tried physical therapy exercises that she has learned in the past over the last several months but these exercises seem to have made her symptoms worse..                ROS: All systems reviewed are negative as they relate to the chief complaint within the history of present illness.  Patient denies fevers or chills.  Assessment & Plan: Visit Diagnoses:  1. Chronic left shoulder pain   2. Neck pain     Plan: Patient is a 55 year old female presents complaining of neck and left shoulder pain with occasional right-sided  pain.  She has tingling all the way down to the fingertips as well as burning quality to her pain.  She has pain with cervical spine range of motion.  Impression is cervical spine pathology with left arm radicular symptoms.  She has tried home therapy exercises but this made her symptoms worse and her symptoms does continue to cause her more distress.  Plan to prescribe Flexeril, steroid Dosepak for symptomatic relief and order MRI of the cervical spine for further evaluation.  She does have radiographs today that demonstrate moderate degenerative changes of the cervical spine with loss of lordosis.  Follow-Up Instructions: No follow-ups on file.   Orders:  Orders Placed This Encounter  Procedures   XR Cervical Spine 2 or 3 views   XR Shoulder Left   MR Cervical Spine w/o contrast   Meds ordered this encounter  Medications   cyclobenzaprine (FLEXERIL) 5 MG tablet    Sig: Take 1 tablet (5 mg total) by mouth 3 (three) times daily as needed for muscle spasms.    Dispense:  30 tablet    Refill:  0   methylPREDNISolone (MEDROL DOSEPAK) 4 MG TBPK tablet    Sig: Take dosepak as directed    Dispense:  21 tablet    Refill:  0      Procedures: No procedures performed   Clinical Data: No additional findings.  Objective: Vital Signs: Ht 5\' 2"  (1.575 m)   Wt 219 lb (99.3 kg)   BMI 40.06 kg/m   Physical Exam:  Constitutional: Patient appears well-developed HEENT:  Head: Normocephalic Eyes:EOM are normal Neck: Normal range of motion Cardiovascular: Normal rate Pulmonary/chest: Effort normal Neurologic: Patient is alert Skin: Skin is warm Psychiatric: Patient has normal mood and affect  Ortho Exam: Ortho exam demonstrates right shoulder with 60 degrees external rotation, 95 degrees abduction, 175 degrees forward flexion.  Left shoulder with 60 degrees external rotation, 90 degrees abduction, 175 degrees forward flexion.  Excellent rotator cuff strength rated 5/5 of supra, infra,  subscap bilaterally.  Tenderness to palpation throughout the axial cervical spine.  Tingling down the left arm with Spurling sign.  Pain with cervical spine range of motion actively, particularly with looking up, to the left, to the floor.  5/5 motor strength of bilateral grip strength, finger abduction, pronation/supination, bicep, tricep, deltoid.  Equivalent bicep tendon reflexes.  Negative Hoffmann sign bilaterally.  No tenderness over the [facility] joint.  Specialty Comments:  MRI cervical spine: 06/09/2015: C5/C6 mild dick with uncovertebral changes with mild right/moderate left foraminal stenosis.  S/p C7/T1 IL ESI on 09/29/2015 with Dr. 11/29/2015.  Imaging: No results found.   PMFS History: Patient Active Problem List   Diagnosis Date Noted   Chronic right-sided low back pain without sciatica 09/17/2019   Morbid obesity (HCC) 04/08/2016   Screen for colon cancer 04/08/2016   HYPERTHYROIDISM 07/16/2009   CHALAZION 07/15/2009   SYSTOLIC MURMUR 07/15/2009   METRORRHAGIA 10/07/2008   BACTERIAL VAGINITIS 02/28/2008   HOT FLASHES 02/28/2008   Pain in limb 08/30/2007   DENTAL CARIES 08/08/2007   ALLERGIC RHINITIS 06/02/2007   OTITIS MEDIA, LEFT 03/09/2007   CARBUNCLE, ARM 03/09/2007   PLANTAR FASCIITIS 05/21/2006   ANEMIA-IRON DEFICIENCY 11/16/2005   Past Medical History:  Diagnosis Date   Anemia    Chest pain    Murmur, cardiac     Family History  Problem Relation Age of Onset   Breast cancer Mother    Diabetes Mother    Hypertension Mother     Past Surgical History:  Procedure Laterality Date   CESAREAN SECTION  [phone]   TUBAL LIGATION  2004   Social History   Occupational History   Occupation: unemployed  Tobacco Use   Smoking status: Never   Smokeless tobacco: Never  Vaping Use   Vaping Use: Never used  Substance and Sexual Activity   Alcohol use: No   Drug use: No   Sexual activity: Not on file

## 2020-08-12 NOTE — Procedures (Signed)
Lumbar Diagnostic Facet Joint Nerve Block with Fluoroscopic Guidance   Patient: Kathryn Miller      Date of Birth: 1965-12-24 MRN: [id number] PCP: Avanell Shackleton, NP-C      Visit Date: 08/06/2020   Universal Protocol:    Date/Time: 07/26/226:04 AM  Consent Given By: the patient  Position: PRONE  Additional Comments: Vital signs were monitored before and after the procedure. Patient was prepped and draped in the usual sterile fashion. The correct patient, procedure, and site was verified.   Injection Procedure Details:   Procedure diagnoses:  1. Spondylosis without myelopathy or radiculopathy, lumbar region      Meds Administered:  Meds ordered this encounter  Medications   betamethasone acetate-betamethasone sodium phosphate (CELESTONE) injection 12 mg   bupivacaine (MARCAINE) 0.5 % (with pres) injection 3 mL     Laterality: Right  Location/Site: L5-S1, L4 medial branch and L5 dorsal ramus  Needle: 5.0 in., 25 ga.  Short bevel or Quincke spinal needle  Needle Placement: Oblique pedical  Findings:   -Comments: There was excellent flow of contrast along the articular pillars without intravascular flow.  Procedure Details: The fluoroscope beam is vertically oriented in AP and then obliqued 15 to 20 degrees to the ipsilateral side of the desired nerve to achieve the "Scotty dog" appearance.  The skin over the target area of the junction of the superior articulating process and the transverse process (sacral ala if blocking the L5 dorsal rami) was locally anesthetized with a 1 ml volume of 1% Lidocaine without Epinephrine.  The spinal needle was inserted and advanced in a trajectory view down to the target.   After contact with periosteum and negative aspirate for blood and CSF, correct placement without intravascular or epidural spread was confirmed by injecting 0.5 ml. of Isovue-250.  A spot radiograph was obtained of this image.    Next, a 0.5 ml. volume of the  injectate described above was injected. The needle was then redirected to the other facet joint nerves mentioned above if needed.  Prior to the procedure, the patient was given a Pain Diary which was completed for baseline measurements.  After the procedure, the patient rated their pain every 30 minutes and will continue rating at this frequency for a total of 5 hours.  The patient has been asked to complete the Diary and return to [place] by mail, fax or hand delivered as soon as possible.   Additional Comments:  The patient tolerated the procedure well Dressing: 2 x 2 sterile gauze and Band-Aid    Post-procedure details: Patient was observed during the procedure. Post-procedure instructions were reviewed.  Patient left the clinic in stable condition.

## 2020-08-12 NOTE — Progress Notes (Signed)
Kathryn Miller - 55 y.o. female MRN [id number]  Date of birth: Feb 15, 1965  Office Visit Note: Visit Date: 08/06/2020 PCP: Avanell Shackleton, NP-C Referred by: Avanell Shackleton, NP-C  Subjective: Chief Complaint  Patient presents with   Lower Back - Pain   Right Leg - Pain   HPI:  Kathryn Miller is a 55 y.o. female who comes in today for planned repeat Right L5-S1 Lumbar facet/medial branch block with fluoroscopic guidance.  The patient has failed conservative care including home exercise, medications, time and activity modification.  This injection will be diagnostic and hopefully therapeutic.  Please see requesting physician notes for further details and justification.  Exam shows concordant low back pain with facet joint loading and extension. Patient received more than 80% pain relief from prior injection. This would be the second block in a diagnostic double block paradigm.     Referring:Dr. Marrianne Mood Dean   ROS Otherwise per HPI.  Assessment & Plan: Visit Diagnoses:    ICD-10-CM   1. Spondylosis without myelopathy or radiculopathy, lumbar region  M47.816 XR C-ARM NO REPORT    Facet Injection    betamethasone acetate-betamethasone sodium phosphate (CELESTONE) injection 12 mg    bupivacaine (MARCAINE) 0.5 % (with pres) injection 3 mL      Plan: No additional findings.   Meds & Orders:  Meds ordered this encounter  Medications   betamethasone acetate-betamethasone sodium phosphate (CELESTONE) injection 12 mg   bupivacaine (MARCAINE) 0.5 % (with pres) injection 3 mL    Orders Placed This Encounter  Procedures   Facet Injection   XR C-ARM NO REPORT    Follow-up: Return for Review Pain Diary.   Procedures: No procedures performed  Lumbar Diagnostic Facet Joint Nerve Block with Fluoroscopic Guidance   Patient: Kathryn Miller      Date of Birth: [birth year] MRN: [id number] PCP: Avanell Shackleton, NP-C      Visit Date: 08/06/2020   Universal Protocol:     Date/Time: 07/26/226:04 AM  Consent Given By: the patient  Position: PRONE  Additional Comments: Vital signs were monitored before and after the procedure. Patient was prepped and draped in the usual sterile fashion. The correct patient, procedure, and site was verified.   Injection Procedure Details:   Procedure diagnoses:  1. Spondylosis without myelopathy or radiculopathy, lumbar region      Meds Administered:  Meds ordered this encounter  Medications   betamethasone acetate-betamethasone sodium phosphate (CELESTONE) injection 12 mg   bupivacaine (MARCAINE) 0.5 % (with pres) injection 3 mL     Laterality: Right  Location/Site: L5-S1, L4 medial branch and L5 dorsal ramus  Needle: 5.0 in., 25 ga.  Short bevel or Quincke spinal needle  Needle Placement: Oblique pedical  Findings:   -Comments: There was excellent flow of contrast along the articular pillars without intravascular flow.  Procedure Details: The fluoroscope beam is vertically oriented in AP and then obliqued 15 to 20 degrees to the ipsilateral side of the desired nerve to achieve the "Scotty dog" appearance.  The skin over the target area of the junction of the superior articulating process and the transverse process (sacral ala if blocking the L5 dorsal rami) was locally anesthetized with a 1 ml volume of 1% Lidocaine without Epinephrine.  The spinal needle was inserted and advanced in a trajectory view down to the target.   After contact with periosteum and negative aspirate for blood and CSF, correct placement without intravascular or epidural spread was confirmed  by injecting 0.5 ml. of Isovue-250.  A spot radiograph was obtained of this image.    Next, a 0.5 ml. volume of the injectate described above was injected. The needle was then redirected to the other facet joint nerves mentioned above if needed.  Prior to the procedure, the patient was given a Pain Diary which was completed for baseline  measurements.  After the procedure, the patient rated their pain every 30 minutes and will continue rating at this frequency for a total of 5 hours.  The patient has been asked to complete the Diary and return to [place] by mail, fax or hand delivered as soon as possible.   Additional Comments:  The patient tolerated the procedure well Dressing: 2 x 2 sterile gauze and Band-Aid    Post-procedure details: Patient was observed during the procedure. Post-procedure instructions were reviewed.  Patient left the clinic in stable condition.   Clinical History: MRI LUMBAR SPINE WITHOUT CONTRAST   TECHNIQUE: Multiplanar, multisequence MR imaging of the lumbar spine was performed. No intravenous contrast was administered.   COMPARISON:  Plain films October 19, 2019   FINDINGS: Segmentation:  Standard.   Alignment:  Trace anterolisthesis of L4 over L5.   Vertebrae:  No fracture, evidence of discitis, or bone lesion.   Conus medullaris and cauda equina: Conus extends to the L1-2 level. Conus and cauda equina appear normal.   Paraspinal and other soft tissues: Negative.   Disc levels:   T12-L1: No spinal canal or neural foraminal stenosis.   L1-2: No spinal canal or neural foraminal stenosis.   L2-3: No spinal canal or neural foraminal stenosis.   L3-4: Shallow disc bulge and mild facet degenerative changes without significant spinal canal or neural foraminal stenosis.   L4-5: Disc bulge with superimposed tiny left far lateral disc protrusion, advanced facet degenerative changes and ligamentum flavum redundancy without significant spinal canal or neural foraminal stenosis.   L5-S1: Small left foraminal/far lateral disc protrusion, advanced right and moderate left facet degenerative changes. Findings result in mild left neural foraminal narrowing. No spinal canal stenosis.   IMPRESSION: 1. Advanced degenerative changes of the facet joints at L4-L5 and L5-S1, more pronounced on  the right at L5-S1. 2. Mild left neural foraminal narrowing at L5-S1. 3. No significant spinal canal stenosis or evidence of nerve impingement.     Electronically Signed   By: Baldemar Lenis M.D.   On: 02/13/2020 16:55     Objective:  VS:  HT:    WT:   BMI:     BP:114/80  HR:(!) 58bpm  TEMP: ( )  RESP:  Physical Exam Vitals and nursing note reviewed.  Constitutional:      General: She is not in acute distress.    Appearance: Normal appearance. She is obese. She is not ill-appearing.  HENT:     Head: Normocephalic and atraumatic.     Right Ear: External ear normal.     Left Ear: External ear normal.  Eyes:     Extraocular Movements: Extraocular movements intact.  Cardiovascular:     Rate and Rhythm: Normal rate.     Pulses: Normal pulses.  Pulmonary:     Effort: Pulmonary effort is normal. No respiratory distress.  Abdominal:     General: There is no distension.     Palpations: Abdomen is soft.  Musculoskeletal:        General: Tenderness present.     Cervical back: Neck supple.     Right lower  leg: No edema.     Left lower leg: No edema.     Comments: Patient has good distal strength with no pain over the greater trochanters.  No clonus or focal weakness. Patient somewhat slow to rise from a seated position to full extension.  There is concordant low back pain with facet loading and lumbar spine extension rotation.  There are no definitive trigger points but the patient is somewhat tender across the lower back and PSIS.  There is no pain with hip rotation.   Skin:    Findings: No erythema, lesion or rash.  Neurological:     General: No focal deficit present.     Mental Status: She is alert and oriented to person, place, and time.     Sensory: No sensory deficit.     Motor: No weakness or abnormal muscle tone.     Coordination: Coordination normal.  Psychiatric:        Mood and Affect: Mood normal.        Behavior: Behavior normal.      Imaging: No results found.

## 2020-08-23 ENCOUNTER — Other Ambulatory Visit: Payer: Self-pay

## 2020-08-23 ENCOUNTER — Ambulatory Visit
Admission: RE | Admit: 2020-08-23 | Discharge: 2020-08-23 | Disposition: A | Discharge status: Home / Self Care | Payer: 59 | Source: Ambulatory Visit | Attending: Surgical | Admitting: Surgical

## 2020-08-23 DIAGNOSIS — M542 Cervicalgia: Secondary | ICD-10-CM

## 2020-12-05 ENCOUNTER — Other Ambulatory Visit: Payer: Self-pay

## 2020-12-05 ENCOUNTER — Telehealth: Payer: 59 | Admitting: Medical

## 2021-02-12 ENCOUNTER — Encounter: Payer: Self-pay | Admitting: Family Medicine

## 2021-02-12 ENCOUNTER — Other Ambulatory Visit: Payer: Self-pay

## 2021-02-12 ENCOUNTER — Ambulatory Visit (INDEPENDENT_AMBULATORY_CARE_PROVIDER_SITE_OTHER): Payer: 59 | Admitting: Family Medicine

## 2021-02-12 VITALS — BP 126/82 | HR 77 | Temp 96.6°F | Wt 218.4 lb

## 2021-02-12 DIAGNOSIS — M25561 Pain in right knee: Secondary | ICD-10-CM

## 2021-02-12 NOTE — Patient Instructions (Signed)
Four Motrin 3 times per day for the pain and you can also use 2 Tylenol 4 times per day as well if you need to.  Heat for 20 minutes 3 times per day.  Recheck here in a couple weeks

## 2021-02-12 NOTE — Progress Notes (Signed)
° °  Subjective:    Patient ID: Kathryn Miller, female    DOB: 1965/07/03, 56 y.o.   MRN: [id number]  HPI She states that on Sunday she had the onset of right knee pain.  She states that it increases with walking and even when she is off her leg and it can increase her discomfort.  No other joints are involved.  No history of injury or overuse.  She has been occasionally taking 5 ibuprofen pills at 1 time which does help.   Review of Systems     Objective:   Physical Exam Exam of the right knee shows no effusion.  The joint is not hot or tender.  No tenderness over joint line, patellar tendon or patella.  Negative anterior drawer.  Medial and lateral collateral ligaments intact.  McMurray's testing negative.       Assessment & Plan:  Acute pain of right knee Four Motrin 3 times per day for the pain and you can also use 2 Tylenol 4 times per day as well if you need to.  Heat for 20 minutes 3 times per day.  Recheck here in a couple weeks She may continue to work.

## 2021-02-26 ENCOUNTER — Ambulatory Visit: Payer: 59 | Admitting: Physician Assistant

## 2021-03-02 ENCOUNTER — Ambulatory Visit: Payer: 59 | Admitting: Physician Assistant

## 2021-03-02 NOTE — Progress Notes (Deleted)
Acute Office Visit  Subjective:    Patient ID: Kathryn Miller, female    DOB: [birth year], 56 y.o.   MRN: [id number]  No chief complaint on file.   HPI Patient is in today for ***  Past Medical History:  Diagnosis Date   Anemia    Chest pain    Murmur, cardiac     Past Surgical History:  Procedure Laterality Date   CESAREAN SECTION  2004,1996,1988,1986   TUBAL LIGATION  2004    Family History  Problem Relation Age of Onset   Breast cancer Mother    Diabetes Mother    Hypertension Mother     Social History   Socioeconomic History   Marital status: Married    Spouse name: Not on file   Number of children: Not on file   Years of education: Not on file   Highest education level: Not on file  Occupational History   Occupation: unemployed  Tobacco Use   Smoking status: Never   Smokeless tobacco: Never  Vaping Use   Vaping Use: Never used  Substance and Sexual Activity   Alcohol use: No   Drug use: No   Sexual activity: Not on file  Other Topics Concern   Not on file  Social History Narrative   Domestic partner since 71 and father of children but did not marry.   Social Determinants of Health   Financial Resource Strain: Not on file  Food Insecurity: Not on file  Transportation Needs: Not on file  Physical Activity: Not on file  Stress: Not on file  Social Connections: Not on file  Intimate Partner Violence: Not on file    Outpatient Medications Prior to Visit  Medication Sig Dispense Refill   baclofen (LIORESAL) 10 MG tablet Take 0.5-1 tablets (5-10 mg total) by mouth 3 (three) times daily as needed for muscle spasms. (Patient not taking: Reported on 02/12/2021) 30 each 3   cyclobenzaprine (FLEXERIL) 5 MG tablet Take 1 tablet (5 mg total) by mouth 3 (three) times daily as needed for muscle spasms. (Patient not taking: Reported on 02/12/2021) 30 tablet 0   Ibuprofen (MOTRIN PO) Take by mouth as needed.      methylPREDNISolone (MEDROL DOSEPAK) 4 MG  TBPK tablet Take dosepak as directed (Patient not taking: Reported on 02/12/2021) 21 tablet 0   No facility-administered medications prior to visit.    Allergies  Allergen Reactions   Codeine Nausea And Vomiting    Review of Systems     Objective:    Physical Exam  There were no vitals taken for this visit. Wt Readings from Last 3 Encounters:  02/12/21 218 lb 6.4 oz (99.1 kg)  08/06/20 219 lb (99.3 kg)  03/17/20 229 lb (103.9 kg)    Health Maintenance Due  Topic Date Due   COVID-19 Vaccine (1) Never done   COLONOSCOPY (Pts 45-36yrs Insurance coverage will need to be confirmed)  Never done   Zoster Vaccines- Shingrix (1 of 2) Never done    There are no preventive care reminders to display for this patient.   Lab Results  Component Value Date   TSH 0.884 09/17/2019   Lab Results  Component Value Date   WBC 7.3 09/17/2019   HGB 12.4 09/17/2019   HCT 36.3 09/17/2019   MCV 77 (L) 09/17/2019   PLT 292 09/17/2019   Lab Results  Component Value Date   NA 139 09/17/2019   K 4.0 09/17/2019   CO2 23 09/17/2019  GLUCOSE 97 09/17/2019   BUN 17 09/17/2019   CREATININE 0.56 (L) 09/17/2019   BILITOT <0.2 09/17/2019   ALKPHOS 99 09/17/2019   AST 11 09/17/2019   ALT 12 09/17/2019   PROT 7.6 09/17/2019   ALBUMIN 4.3 09/17/2019   CALCIUM 9.2 09/17/2019   ANIONGAP 10 12/29/2018   Lab Results  Component Value Date   CHOL 216 (H) 09/17/2019   Lab Results  Component Value Date   HDL 67 09/17/2019   Lab Results  Component Value Date   LDLCALC 139 (H) 09/17/2019   Lab Results  Component Value Date   TRIG 55 09/17/2019   Lab Results  Component Value Date   CHOLHDL 3.2 09/17/2019   No results found for: HGBA1C     Assessment & Plan:   Problem List Items Addressed This Visit   None    No orders of the defined types were placed in this encounter.    Irene Pap, PA-C

## 2021-03-03 ENCOUNTER — Encounter: Payer: Self-pay | Admitting: Physician Assistant

## 2021-03-09 ENCOUNTER — Ambulatory Visit (HOSPITAL_COMMUNITY)
Admission: EM | Admit: 2021-03-09 | Discharge: 2021-03-09 | Disposition: A | Discharge status: Home / Self Care | Payer: Self-pay | Attending: Family Medicine | Admitting: Family Medicine

## 2021-03-09 ENCOUNTER — Encounter (HOSPITAL_COMMUNITY): Payer: Self-pay | Admitting: *Deleted

## 2021-03-09 ENCOUNTER — Ambulatory Visit (INDEPENDENT_AMBULATORY_CARE_PROVIDER_SITE_OTHER): Payer: Self-pay

## 2021-03-09 ENCOUNTER — Other Ambulatory Visit: Payer: Self-pay

## 2021-03-09 DIAGNOSIS — M25561 Pain in right knee: Secondary | ICD-10-CM

## 2021-03-09 DIAGNOSIS — G8929 Other chronic pain: Secondary | ICD-10-CM

## 2021-03-09 MED ORDER — DICLOFENAC SODIUM 75 MG PO TBEC: 75.0000 mg | DELAYED_RELEASE_TABLET | Freq: Two times a day (BID) | ORAL | 0 refills | Status: DC

## 2021-03-09 NOTE — ED Notes (Signed)
Order discontinued for knee sleeve. Size was not big enough, ace wrap applied per Medical Providers recommendation.

## 2021-03-09 NOTE — ED Provider Notes (Signed)
[facility]    CSN: [id number] Arrival date & time: 03/09/21  0349      History   Chief Complaint Chief Complaint  Patient presents with   Knee Pain    Rt    HPI CARREN ALTHEIDE is a 56 y.o. female presenting with right knee pain for 1 month.  Medical history morbid obesity, chronic pain of multiple joints, she is followed closely by Ortho Dr. August Saucer; last visit with him was about 6 months ago. Has also been evaluated by PCP for this already on 02/12/21. Describes pain and stiffness of the R knee, worse with movement and at night. Denies trauma but does endorse overuse. No relief with tylenol/ibuprofen and icyhot.  HPI  Past Medical History:  Diagnosis Date   Anemia    Chest pain    Murmur, cardiac     Patient Active Problem List   Diagnosis Date Noted   Chronic right-sided low back pain without sciatica 09/17/2019   Morbid obesity (HCC) 04/08/2016   Screen for colon cancer 04/08/2016   HYPERTHYROIDISM 07/16/2009   CHALAZION 07/15/2009   SYSTOLIC MURMUR 07/15/2009   METRORRHAGIA 10/07/2008   BACTERIAL VAGINITIS 02/28/2008   HOT FLASHES 02/28/2008   Pain in limb 08/30/2007   DENTAL CARIES 08/08/2007   ALLERGIC RHINITIS 06/02/2007   OTITIS MEDIA, LEFT 03/09/2007   CARBUNCLE, ARM 03/09/2007   PLANTAR FASCIITIS 05/21/2006   ANEMIA-IRON DEFICIENCY 11/16/2005    Past Surgical History:  Procedure Laterality Date   CESAREAN SECTION  [phone]   TUBAL LIGATION  2004    OB History   No obstetric history on file.      Home Medications    Prior to Admission medications   Medication Sig Start Date End Date Taking? Authorizing Provider  diclofenac (VOLTAREN) 75 MG EC tablet Take 1 tablet (75 mg total) by mouth 2 (two) times daily. 03/09/21  Yes Rhys Martini, PA-C  Ibuprofen (MOTRIN PO) Take by mouth as needed.     [provider]    Family History Family History  Problem Relation Age of Onset   Breast cancer Mother    Diabetes  Mother    Hypertension Mother     Social History Social History   Tobacco Use   Smoking status: Never   Smokeless tobacco: Never  Vaping Use   Vaping Use: Never used  Substance Use Topics   Alcohol use: No   Drug use: No     Allergies   Codeine   Review of Systems Review of Systems  Musculoskeletal:        R knee pain   All other systems reviewed and are negative.   Physical Exam Triage Vital Signs ED Triage Vitals  Enc Vitals Group     BP 03/09/21 1110 124/83     Pulse Rate 03/09/21 1110 65     Resp 03/09/21 1110 18     Temp 03/09/21 1110 98.1 F (36.7 C)     Temp src --      SpO2 03/09/21 1110 98 %     Weight --      Height --      Head Circumference --      Peak Flow --      Pain Score 03/09/21 1109 8     Pain Loc --      Pain Edu? --      Excl. in GC? --    No data found.  Updated Vital Signs BP 124/83  Pulse 65    Temp 98.1 F (36.7 C)    Resp 18    SpO2 98%   Visual Acuity Right Eye Distance:   Left Eye Distance:   Bilateral Distance:    Right Eye Near:   Left Eye Near:    Bilateral Near:     Physical Exam Vitals reviewed.  Constitutional:      General: She is not in acute distress.    Appearance: Normal appearance. She is not ill-appearing.  HENT:     Head: Normocephalic and atraumatic.  Pulmonary:     Effort: Pulmonary effort is normal.  Musculoskeletal:     Comments: R knee- no skin changes or swelling. R knee measures 42cm and L knee measures 42cm. Diffusely TTP worse over the medial jointline. Stiffness and crepitus with flexion and extension. Negative valgus/varus stress test. Positive McMurray. Ambulating with pain. DP 2+.  Neurological:     General: No focal deficit present.     Mental Status: She is alert and oriented to person, place, and time.  Psychiatric:        Mood and Affect: Mood normal.        Behavior: Behavior normal.        Thought Content: Thought content normal.        Judgment: Judgment normal.      UC Treatments / Results  Labs (all labs ordered are listed, but only abnormal results are displayed) Labs Reviewed - No data to display  EKG   Radiology DG Knee Complete 4 Views Right  Result Date: 03/09/2021 CLINICAL DATA:  Medial pain and swelling, chronic pain, no reported injury. EXAM: RIGHT KNEE - COMPLETE 4+ VIEW COMPARISON:  None. FINDINGS: No acute osseous or joint abnormality. There may be slight varus angulation at the knee. Trace patellofemoral osteophytosis. IMPRESSION: 1. No acute findings. 2. Trace patellofemoral osteophytosis. Suspect mild varus angulation of the knee. Electronically Signed   By: Leanna Battles M.D.   On: 03/09/2021 11:37    Procedures Procedures (including critical care time)  Medications Ordered in UC Medications - No data to display  Initial Impression / Assessment and Plan / UC Course  I have reviewed the triage vital signs and the nursing notes.  Pertinent labs & imaging results that were available during my care of the patient were reviewed by me and considered in my medical decision making (see chart for details).     This patient is a very pleasant 56 y.o. year old female presenting with R knee OA. Neurovascularly intact.   Xray R knee - 1. No acute findings. 2. Trace patellofemoral osteophytosis. Suspect mild varus angulation of the knee.  Voltaren PO and knee brace. Already followed by ortho - Dr. August Saucer - f/u with him for additional management.  ED return precautions discussed. Patient verbalizes understanding and agreement.    Final Clinical Impressions(s) / UC Diagnoses   Final diagnoses:  Acute pain of right knee     Discharge Instructions      -Voltaren for pain up to twice daily. Avoid other NSAIDs while taking this medication like ibuprofen. You can still take tylenol -Ace wrap or knee brace while symptoms persist  -Follow-up with orthopedist Dr. August Saucer for additional management.     ED Prescriptions      Medication Sig Dispense Auth. Provider   diclofenac (VOLTAREN) 75 MG EC tablet Take 1 tablet (75 mg total) by mouth 2 (two) times daily. 20 tablet Rhys Martini, PA-C  PDMP not reviewed this encounter.   Rhys Martini, PA-C 03/09/21 1207

## 2021-03-09 NOTE — ED Triage Notes (Signed)
Pt reports rt knee pain started one month ago. Pt denies any injury.

## 2021-03-09 NOTE — Discharge Instructions (Addendum)
-  Voltaren for pain up to twice daily. Avoid other NSAIDs while taking this medication like ibuprofen. You can still take tylenol -Ace wrap or knee brace while symptoms persist  -Follow-up with orthopedist Dr. August Saucer for additional management.

## 2021-03-13 ENCOUNTER — Telehealth: Payer: Self-pay | Admitting: Surgical

## 2021-03-13 ENCOUNTER — Ambulatory Visit (INDEPENDENT_AMBULATORY_CARE_PROVIDER_SITE_OTHER): Payer: 59 | Admitting: Surgical

## 2021-03-13 ENCOUNTER — Other Ambulatory Visit: Payer: Self-pay | Admitting: Surgical

## 2021-03-13 ENCOUNTER — Other Ambulatory Visit: Payer: Self-pay

## 2021-03-13 ENCOUNTER — Encounter: Payer: Self-pay | Admitting: Surgical

## 2021-03-13 DIAGNOSIS — M25561 Pain in right knee: Secondary | ICD-10-CM | POA: Diagnosis not present

## 2021-03-13 MED ORDER — HYDROCODONE-ACETAMINOPHEN 5-325 MG PO TABS
1.0000 | ORAL_TABLET | Freq: Two times a day (BID) | ORAL | 0 refills | Status: DC | PRN
Start: 2021-03-13 — End: 2021-03-16

## 2021-03-13 NOTE — Telephone Encounter (Signed)
Sent in pain med.  Tried to contact patient, she did not pick up. Could not leave VM

## 2021-03-13 NOTE — Progress Notes (Signed)
Office Visit Note   Patient: Kathryn Miller           Date of Birth: 08/12/1965           MRN: [id number] Visit Date: 03/13/2021 Requested by: Jake Shark, PA-C [address] [place],  [place] [address] PCP: Lexine Baton  Subjective: Chief Complaint  Patient presents with   Right Knee - Pain    HPI: Kathryn Miller is a 56 y.o. female who presents to the office complaining of right knee pain.  Patient complains of medial sided right knee pain.  She has noticed increased pain in her right knee that she localizes to the medial proximal tibia over the last several weeks.  This all came to ahead yesterday when she was just walking and felt a pop with severe increase in pain and inability to bear weight on the right lower extremity.  She states she feels like her knee is swollen.  She denies any radicular pain.  She mostly localizes pain to the medial aspect of the knee along the medial proximal tibia and medial distal femur.  Denies any mechanical clicking sensation.  No groin pain.  She states she really has not had any trouble with this knee in the past.  No previous surgical history to the knee..                ROS: All systems reviewed are negative as they relate to the chief complaint within the history of present illness.  Patient denies fevers or chills.  Assessment & Plan: Visit Diagnoses:  1. Right knee pain, unspecified chronicity     Plan: Patient is a 56 year old female who presents for evaluation of right knee pain.  She has recent radiographs that show minimal degenerative changes but no significant osteoarthritis.  She has been having worsening pain over the last several weeks in the right knee that all came to ahead yesterday when she felt a pop and now cannot weight-bear on her right leg.  She has severe tenderness diffusely through the right medial knee.  Examination is limited by patient's pain so ligamentous stability is very difficult to ascertain.   She does have medial joint line tenderness as well as a small effusion.  Extensor mechanism seems intact.  Plan for further evaluation of medial right knee pain with difficulty bearing weight with MRI of the right knee urgently.  Provided a knee immobilizer for patient and she will be out of work for the next 2 weeks at least as we figure out what is going on with her knee.  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  Follow-Up Instructions: No follow-ups on file.   Orders:  Orders Placed This Encounter  Procedures   MR Knee Right w/o contrast   No orders of the defined types were placed in this encounter.     Procedures: No procedures performed   Clinical Data: No additional findings.  Objective: Vital Signs: There were no vitals taken for this visit.  Physical Exam:  Constitutional: Patient appears well-developed HEENT:  Head: Normocephalic Eyes:EOM are normal Neck: Normal range of motion Cardiovascular: Normal rate Pulmonary/chest: Effort normal Neurologic: Patient is alert Skin: Skin is warm Psychiatric: Patient has normal mood and affect  Ortho Exam: Ortho exam demonstrates right knee with small effusion noted.  She has severe pain on exam and is guarding consistently throughout the exam making  ligamentous examination very difficult.  She has exquisite tenderness over the medial aspect of the knee diffusely but primarily over the Pez anserine bursa and the attachments of the MCL.  She also has moderate tenderness over the medial joint line.  Not much tenderness over the lateral aspect of the knee.  She is able to perform straight leg raise with good quadricep strength rated 5/5 when she puts effort in.  She has range of motion from 0 degrees extension to about 90 degrees of knee flexion but anything past 90 degrees causes severe pain and she refuses to flex more.  No calf tenderness.  Negative Homans' sign.  No pain with  hip range of motion.  Negative Stinchfield sign.  Negative straight leg raise.  MCL feels stable at 0 and 30 degrees.  LCL feel stable at 0 and 30 degrees as well.  Specialty Comments:  MRI cervical spine: 06/09/2015: C5/C6 mild dick with uncovertebral changes with mild right/moderate left foraminal stenosis.  S/p C7/T1 IL ESI on 09/29/2015 with Dr. Alvester Morin.  Imaging: No results found.   PMFS History: Patient Active Problem List   Diagnosis Date Noted   Chronic right-sided low back pain without sciatica 09/17/2019   Morbid obesity (HCC) 04/08/2016   Screen for colon cancer 04/08/2016   HYPERTHYROIDISM 07/16/2009   CHALAZION 07/15/2009   SYSTOLIC MURMUR 07/15/2009   METRORRHAGIA 10/07/2008   BACTERIAL VAGINITIS 02/28/2008   HOT FLASHES 02/28/2008   Pain in limb 08/30/2007   DENTAL CARIES 08/08/2007   ALLERGIC RHINITIS 06/02/2007   OTITIS MEDIA, LEFT 03/09/2007   CARBUNCLE, ARM 03/09/2007   PLANTAR FASCIITIS 05/21/2006   ANEMIA-IRON DEFICIENCY 11/16/2005   Past Medical History:  Diagnosis Date   Anemia    Chest pain    Murmur, cardiac     Family History  Problem Relation Age of Onset   Breast cancer Mother    Diabetes Mother    Hypertension Mother     Past Surgical History:  Procedure Laterality Date   CESAREAN SECTION  [phone]   TUBAL LIGATION  2004   Social History   Occupational History   Occupation: unemployed  Tobacco Use   Smoking status: Never   Smokeless tobacco: Never  Vaping Use   Vaping Use: Never used  Substance and Sexual Activity   Alcohol use: No   Drug use: No   Sexual activity: Not on file

## 2021-03-13 NOTE — Telephone Encounter (Signed)
Patient called advised she just left the pharmacy and her pain medication has not been called in yet.  The number to contact patient is [phone]

## 2021-03-16 ENCOUNTER — Telehealth: Payer: Self-pay | Admitting: Orthopedic Surgery

## 2021-03-16 ENCOUNTER — Other Ambulatory Visit: Payer: Self-pay | Admitting: Surgical

## 2021-03-16 MED ORDER — TRAMADOL HCL 50 MG PO TABS: 50.0000 mg | ORAL_TABLET | Freq: Two times a day (BID) | ORAL | 0 refills | Status: DC | PRN

## 2021-03-16 NOTE — Telephone Encounter (Signed)
Pt called requesting a new pain medication be called to pharmacy on file. Pt states she was seen on Friday 2/24 and pain meds was sent in to her pharmacy but she states pharmacy states they have been out of the pain meds prescribed for weeks and unsure when they will be back in stock. Please call this patient about this matter at [phone].

## 2021-03-16 NOTE — Telephone Encounter (Signed)
Sent in tramadol instead of norco

## 2021-03-16 NOTE — Telephone Encounter (Signed)
I called patient and advised. She also asked if there was any new information regarding MRI. I advised everything has been sent to her insurance, and that we are awaiting authorization.

## 2021-03-17 ENCOUNTER — Ambulatory Visit (HOSPITAL_COMMUNITY)
Admission: RE | Admit: 2021-03-17 | Discharge: 2021-03-17 | Disposition: A | Discharge status: Home / Self Care | Payer: 59 | Source: Ambulatory Visit | Attending: Surgical | Admitting: Surgical

## 2021-03-17 ENCOUNTER — Other Ambulatory Visit: Payer: Self-pay

## 2021-03-17 DIAGNOSIS — M25561 Pain in right knee: Secondary | ICD-10-CM | POA: Insufficient documentation

## 2021-03-18 ENCOUNTER — Ambulatory Visit (HOSPITAL_COMMUNITY): Payer: 59

## 2021-03-18 ENCOUNTER — Telehealth: Payer: Self-pay | Admitting: Orthopedic Surgery

## 2021-03-18 ENCOUNTER — Other Ambulatory Visit: Payer: Self-pay | Admitting: Surgical

## 2021-03-18 MED ORDER — OXYCODONE-ACETAMINOPHEN 5-325 MG PO TABS: 1.0000 | ORAL_TABLET | Freq: Three times a day (TID) | ORAL | 0 refills | Status: DC | PRN

## 2021-03-18 NOTE — Telephone Encounter (Signed)
This medication was sent in earlier today

## 2021-03-18 NOTE — Progress Notes (Signed)
Needs follow-up appointment with Dr. August Saucer in 1 to 2 weeks

## 2021-03-18 NOTE — Telephone Encounter (Signed)
Called pt to sch follow up with provider and while on the phone to pt wanted to check the status of another medication that was being called in for her pain as it had intensified. Pt stated the best call back number is [phone] and the best pharmacy to send it to is [id number] - [place], [place] - [address] ?

## 2021-03-18 NOTE — Telephone Encounter (Signed)
Answered in a different message.

## 2021-03-19 ENCOUNTER — Other Ambulatory Visit: Payer: Self-pay | Admitting: Surgical

## 2021-03-19 ENCOUNTER — Telehealth: Payer: Self-pay | Admitting: Orthopedic Surgery

## 2021-03-19 MED ORDER — OXYCODONE HCL 5 MG PO CAPS: 5.0000 mg | ORAL_CAPSULE | Freq: Three times a day (TID) | ORAL | 0 refills | Status: DC | PRN

## 2021-03-19 NOTE — Telephone Encounter (Signed)
Pt called stating she went to pick up pain medication from her pharmacy and they are out of those meds too. Please call pt about this matter at [phone]. ?

## 2021-03-19 NOTE — Telephone Encounter (Signed)
Sent in diffferent RX for oxycodone IR instead of Percocet

## 2021-03-20 NOTE — Telephone Encounter (Signed)
IC advise.  

## 2021-03-23 ENCOUNTER — Ambulatory Visit (INDEPENDENT_AMBULATORY_CARE_PROVIDER_SITE_OTHER): Payer: 59 | Admitting: Orthopedic Surgery

## 2021-03-23 ENCOUNTER — Other Ambulatory Visit: Payer: Self-pay

## 2021-03-23 DIAGNOSIS — S83411D Sprain of medial collateral ligament of right knee, subsequent encounter: Secondary | ICD-10-CM

## 2021-03-23 MED ORDER — IBUPROFEN 800 MG PO TABS: 800.0000 mg | ORAL_TABLET | Freq: Three times a day (TID) | ORAL | 1 refills | Status: DC | PRN

## 2021-03-27 ENCOUNTER — Encounter: Payer: Self-pay | Admitting: Orthopedic Surgery

## 2021-03-27 ENCOUNTER — Encounter: Payer: Self-pay | Admitting: Physician Assistant

## 2021-03-27 NOTE — Progress Notes (Signed)
? ?Office Visit Note ?  ?Patient: Kathryn Miller           ?Date of Birth: 25-Jan-1965           ?MRN: [id number] ?Visit Date: 03/23/2021 ?Requested by: Jake Shark, PA-C ?[address] ?[place],  [place] [address] ?PCP: Jake Shark, PA-C ? ?Subjective: ?Chief Complaint  ?Patient presents with  ? Other  ?  Scan review  ? ? ?HPI: Kathryn Miller is a 56 y.o. female who presents to the office for MRI review. Patient denies any changes in symptoms.  Continues to complain mainly of right knee pain in the medial aspect of the knee.  Pain is worse at night.  She denies any groin pain.  She has been able to weight-bear which is new since initially ordering the MRI scan. ? ?MRI results revealed: ?MR Knee Right w/o contrast ? ?Result Date: 03/17/2021 ?CLINICAL DATA:  Medial right knee pain EXAM: MRI OF THE RIGHT KNEE WITHOUT CONTRAST TECHNIQUE: Multiplanar, multisequence MR imaging of the knee was performed. No intravenous contrast was administered. COMPARISON:  Radiographs 03/09/2021 FINDINGS: Despite efforts by the technologist and patient, motion artifact is present on today's exam and could not be eliminated. This reduces exam sensitivity and specificity. MENISCI Medial meniscus: Equivocal blunting of the free edge of the posterior horn on images 5-6 of series 8. This could reflect a small free edge tear, but the substantial motion artifact introduces some uncertainty. Lateral meniscus:  Grossly intact LIGAMENTS Cruciates:  Unremarkable Collaterals: Partial tearing or high-grade sprain of the proximal MCL with associated thickening is shown on image 10 series 11. Edema tracks superficial to the MCL. The fibular collateral ligament appears intact. CARTILAGE Patellofemoral: 0.9 by 0.8 cm osteochondral lesion inferiorly along the central femoral trochlear groove as shown on image 16 series 5, with underlying degenerative subcortical cystic appearance and overlying chondral thinning and irregularity. The  remainder of the patellofemoral articular cartilage appears intact. Medial:  Minimal degenerative chondral thinning. Lateral:  Unremarkable Joint:  Trace knee effusion. Popliteal Fossa:  Unremarkable Extensor Mechanism:  Unremarkable Bones: No significant extra-articular osseous abnormalities identified. Other: No supplemental non-categorized findings. IMPRESSION: 1. Partial tearing or grade 2 sprain of the proximal MCL with overlying edema. 2. Suspected small free edge tear of the posterior horn medial meniscus. The substantial motion artifact introduces some uncertainty as to this finding. 3. 9 mm osteochondral lesion inferiorly along the central femoral trochlear groove. Minimal degenerative chondral thinning in the medial compartment. 4. Trace knee effusion. Electronically Signed   By: Gaylyn Rong M.D.   On: 03/17/2021 21:49   ? ?             ?ROS: All systems reviewed are negative as they relate to the chief complaint within the history of present illness.  Patient denies fevers or chills. ? ?Assessment & Plan: ?Visit Diagnoses:  ?1. Sprain of medial collateral ligament of right knee, subsequent encounter   ? ? ?Plan: Kathryn Miller is a 56 y.o. female who presents to the office for evaluation of right knee pain.  Here to review MRI scan that was obtained when patient had injury and was unable to bear weight.  MRI demonstrates grade 2 MCL sprain.  Today, it seems her pain is improved based on her ability to now bear weight and reduce tenderness on exam.  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  Plan to prescribe Motrin 800 mg for symptomatic relief.  Work note provided out of work for 3 weeks with reevaluation at that time.  Follow-up with her weeks for clinical recheck.  Did give her a prescription for a custom knee brace such as a Bledsoe brace that I advised that she wear to reduce stress  on the MCL and allow to heal.  Try to hinged knee brace today but due to her body habitus, this did not fit her.  Follow-up in 3 weeks with Dr. August Saucer. ? ?Follow-Up Instructions: No follow-ups on file.  ? ?Orders:  ?No orders of the defined types were placed in this encounter. ? ?Meds ordered this encounter  ?Medications  ? ibuprofen (ADVIL) 800 MG tablet  ?  Sig: Take 1 tablet (800 mg total) by mouth every 8 (eight) hours as needed.  ?  Dispense:  30 tablet  ?  Refill:  1  ? ? ? ? Procedures: ?No procedures performed ? ? ?Clinical Data: ?No additional findings. ? ?Objective: ?Vital Signs: There were no vitals taken for this visit. ? ?Physical Exam:  ?Constitutional: Patient appears well-developed ?HEENT:  ?Head: Normocephalic ?Eyes:EOM are normal ?Neck: Normal range of motion ?Cardiovascular: Normal rate ?Pulmonary/chest: Effort normal ?Neurologic: Patient is alert ?Skin: Skin is warm ?Psychiatric: Patient has normal mood and affect ? ?Ortho Exam: Ortho exam demonstrates decreased apprehension and pain on exam compared with last visit.  She has trace effusion.  Mild tenderness over the medial joint line.  No tenderness over the lateral joint line.  She does have increased pain in the medial aspect of the knee where most of her pain is with stressing of the MCL.  She has tenderness primarily over the proximal insertion of the MCL.  No pain with varus stressing of the knee.  Able to perform straight leg raise with some difficulty due to pain.  She is able to ambulate with some antalgia. ? ?Specialty Comments:  ?No specialty comments available. ? ?Imaging: ?No results found. ? ? ?PMFS History: ?Patient Active Problem List  ? Diagnosis Date Noted  ? Chronic right-sided low back pain without sciatica 09/17/2019  ? Morbid obesity (HCC) 04/08/2016  ? Screen for colon cancer 04/08/2016  ? HYPERTHYROIDISM 07/16/2009  ? CHALAZION 07/15/2009  ? SYSTOLIC MURMUR 07/15/2009  ? METRORRHAGIA 10/07/2008  ? BACTERIAL VAGINITIS  02/28/2008  ? HOT FLASHES 02/28/2008  ? Pain in limb 08/30/2007  ? DENTAL CARIES 08/08/2007  ? ALLERGIC RHINITIS 06/02/2007  ? OTITIS MEDIA, LEFT 03/09/2007  ? CARBUNCLE, ARM 03/09/2007  ? PLANTAR FASCIITIS 05/21/2006  ? ANEMIA-IRON DEFICIENCY 11/16/2005  ? ?Past Medical History:  ?Diagnosis Date  ? Anemia   ? Chest pain   ? Murmur, cardiac   ?  ?Family History  ?Problem Relation Age of Onset  ? Breast cancer Mother   ? Diabetes Mother   ? Hypertension Mother   ?  ?Past Surgical History:  ?Procedure Laterality Date  ? CESAREAN SECTION  [phone]  ? TUBAL LIGATION  2004  ? ?Social History  ? ?Occupational History  ? Occupation: unemployed  ?Tobacco Use  ? Smoking status: Never  ? Smokeless tobacco: Never  ?Vaping Use  ? Vaping Use: Never used  ?Substance and Sexual Activity  ? Alcohol use: No  ? Drug use: No  ? Sexual activity: Not on file  ? ? ? ? ?   ?

## 2021-04-01 ENCOUNTER — Telehealth: Payer: Self-pay | Admitting: Orthopedic Surgery

## 2021-04-01 NOTE — Telephone Encounter (Signed)
Pt submitted medical release form, FMLA forms, and $25.00 money order payment to [employer]. Accepted 04/01/21 ?

## 2021-04-02 ENCOUNTER — Telehealth: Payer: Self-pay | Admitting: Orthopedic Surgery

## 2021-04-02 NOTE — Telephone Encounter (Signed)
Pt aware Rx sent to [facility] ?

## 2021-04-02 NOTE — Telephone Encounter (Signed)
Kathryn Miller states that she is trying to obtain the Bledsoe Brace from [facility] and they advised her they could not accept Rx because it was missing the provider's signature.  Please call her to let her know how we can fix this [phone]. ?

## 2021-04-02 NOTE — Telephone Encounter (Signed)
Pt went to [facility] for Brace for the right Knee. ? ? ?[facility] states they need to know what the Range of Motion should be for the pt. ? ?Please call [facility] @ [phone], pt has an appt with Christy Gentles on 04-09-21 @ 10:00 ?

## 2021-04-02 NOTE — Telephone Encounter (Signed)
Pt calling in to get Dr Forbes Cellar full name as the top of the prescription got wet  ?

## 2021-04-02 NOTE — Telephone Encounter (Signed)
Full range of motion is okay. Just need it for support while she is ambulatory

## 2021-04-03 NOTE — Telephone Encounter (Signed)
IC advised.  

## 2021-04-08 ENCOUNTER — Telehealth: Payer: Self-pay | Admitting: Orthopedic Surgery

## 2021-04-08 NOTE — Telephone Encounter (Signed)
Patient called advised she received a call from [facility] and was told the brace is $400.00. Patient said she can not afford to pay that much for a brace. Patient asked if there is anything else that can be done? The number to contact patient is [phone] ?

## 2021-04-08 NOTE — Telephone Encounter (Signed)
Hinge knee brace did not fit her so I think the other option would be to continue with knee immobilizer when WB and keep doing knee range of motion exercises when she is sedentary to prevent stiffness

## 2021-04-09 NOTE — Telephone Encounter (Signed)
thx

## 2021-04-09 NOTE — Telephone Encounter (Signed)
Contacted patient and made her aware.  

## 2021-04-13 ENCOUNTER — Ambulatory Visit (INDEPENDENT_AMBULATORY_CARE_PROVIDER_SITE_OTHER): Payer: 59 | Admitting: Orthopedic Surgery

## 2021-04-13 ENCOUNTER — Other Ambulatory Visit: Payer: Self-pay

## 2021-04-13 DIAGNOSIS — S83411D Sprain of medial collateral ligament of right knee, subsequent encounter: Secondary | ICD-10-CM

## 2021-04-15 ENCOUNTER — Encounter: Payer: Self-pay | Admitting: Orthopedic Surgery

## 2021-04-15 NOTE — Progress Notes (Signed)
? ?Office Visit Note ?  ?Patient: Kathryn Miller           ?Date of Birth: 1965/12/09           ?MRN: [id number] ?Visit Date: 04/13/2021 ?Requested by: Jake Shark, PA-C ?[address] ?[place],  [place] [address] ?PCP: Jake Shark, PA-C ? ?Subjective: ?Chief Complaint  ?Patient presents with  ? Right Knee - Follow-up  ? ? ?HPI: Kathryn Miller is a 56 year old patient with MCL sprain.  She is 6 weeks out from injury.  Has been ambulating weightbearing as tolerated in Bledsoe brace.  Reports some medial sided burning and tenderness.  MRI scan does show grade 2 MCL sprain. ?             ?ROS: All systems reviewed are negative as they relate to the chief complaint within the history of present illness.  Patient denies  fevers or chills. ? ? ?Assessment & Plan: ?Visit Diagnoses:  ?1. Sprain of medial collateral ligament of right knee, subsequent encounter   ? ? ?Plan: Impression is grade 2 MCL sprain with continued symptoms.  Overall her motion is reasonable.  MCL feels like it is healed with pretty good stability comparable to the left-hand side with valgus stress at 0 and 30 degrees.  At this time I think she is okay to return to work for [employer] on a as needed basis with brace on knee for the first 3 weeks.  Bledsoe brace is adjusted today to allow for flexion. ? ?Follow-Up Instructions: Return if symptoms worsen or fail to improve.  ? ?Orders:  ?No orders of the defined types were placed in this encounter. ? ?No orders of the defined types were placed in this encounter. ? ? ? ? Procedures: ?No procedures performed ? ? ?Clinical Data: ?No additional findings. ? ?Objective: ?Vital Signs: There were no vitals taken for this visit. ? ?Physical Exam:  ? ?Constitutional: Patient appears well-developed ?HEENT:  ?Head: Normocephalic ?Eyes:EOM are normal ?Neck: Normal range of motion ?Cardiovascular: Normal rate ?Pulmonary/chest: Effort normal ?Neurologic: Patient is alert ?Skin: Skin is warm ?Psychiatric: Patient has  normal mood and affect ? ? ?Ortho Exam: Ortho exam demonstrates full active and passive range of motion of the hip.  Knee has excellent range of motion with medial sided tenderness.  Good stability to valgus stress at 0 and 30 degrees with only 1 to 2 mm opening on both left and right knee.  No effusion in the knee ? ?Specialty Comments:  ?No specialty comments available. ? ?Imaging: ?No results found. ? ? ?PMFS History: ?Patient Active Problem List  ? Diagnosis Date Noted  ? Chronic right-sided low back pain without sciatica 09/17/2019  ? Morbid obesity (HCC) 04/08/2016  ? Screen for colon cancer 04/08/2016  ? HYPERTHYROIDISM 07/16/2009  ? CHALAZION 07/15/2009  ? SYSTOLIC MURMUR 07/15/2009  ? METRORRHAGIA 10/07/2008  ? BACTERIAL VAGINITIS 02/28/2008  ? HOT FLASHES 02/28/2008  ? Pain in limb 08/30/2007  ? DENTAL CARIES 08/08/2007  ? ALLERGIC RHINITIS 06/02/2007  ? OTITIS MEDIA, LEFT 03/09/2007  ? CARBUNCLE, ARM 03/09/2007  ? PLANTAR FASCIITIS 05/21/2006  ? ANEMIA-IRON DEFICIENCY 11/16/2005  ? ?Past Medical History:  ?Diagnosis Date  ? Anemia   ? Chest pain   ? Murmur, cardiac   ?  ?Family History  ?Problem Relation Age of Onset  ? Breast cancer Mother   ? Diabetes Mother   ? Hypertension Mother   ?  ?Past Surgical History:  ?Procedure Laterality Date  ? CESAREAN  SECTION  [phone]  ? TUBAL LIGATION  2004  ? ?Social History  ? ?Occupational History  ? Occupation: unemployed  ?Tobacco Use  ? Smoking status: Never  ? Smokeless tobacco: Never  ?Vaping Use  ? Vaping Use: Never used  ?Substance and Sexual Activity  ? Alcohol use: No  ? Drug use: No  ? Sexual activity: Not on file  ? ? ? ? ? ?

## 2021-04-20 ENCOUNTER — Encounter: Payer: Self-pay | Admitting: Orthopedic Surgery

## 2021-04-21 ENCOUNTER — Other Ambulatory Visit: Payer: Self-pay | Admitting: Physical Medicine and Rehabilitation

## 2021-04-21 ENCOUNTER — Telehealth: Payer: Self-pay | Admitting: Physical Medicine and Rehabilitation

## 2021-04-21 ENCOUNTER — Other Ambulatory Visit: Payer: Self-pay

## 2021-04-21 DIAGNOSIS — S83411D Sprain of medial collateral ligament of right knee, subsequent encounter: Secondary | ICD-10-CM

## 2021-04-21 DIAGNOSIS — M47899 Other spondylosis, site unspecified: Secondary | ICD-10-CM

## 2021-04-21 DIAGNOSIS — M545 Low back pain, unspecified: Secondary | ICD-10-CM

## 2021-04-21 NOTE — Telephone Encounter (Signed)
She should do pt here for rom and strengthening pls calal thx 3 x week 4 weeks

## 2021-04-21 NOTE — Progress Notes (Unsigned)
Spoke with patient via telephone, reports greater than 80% relief of pain with 2nd facet joint/medial branch block on 08/06/2021. Patient would like to proceed with radiofrequency ablation. I will place referral. Please see notes from previous facet joint/medial branch blocks, both provided her with greater than 80% pain relief.  ?

## 2021-04-21 NOTE — Telephone Encounter (Signed)
Patient called needing to schedule an appointment with Dr. Ernestina Patches for her back.  Ph# [phone] ?

## 2021-04-21 NOTE — Telephone Encounter (Signed)
Called pt to get sch. Couldn't leave VM, box was full  ?

## 2021-05-05 ENCOUNTER — Other Ambulatory Visit: Payer: Self-pay

## 2021-05-05 ENCOUNTER — Ambulatory Visit (INDEPENDENT_AMBULATORY_CARE_PROVIDER_SITE_OTHER): Payer: 59 | Admitting: Physical Therapy

## 2021-05-05 ENCOUNTER — Encounter: Payer: Self-pay | Admitting: Physical Therapy

## 2021-05-05 DIAGNOSIS — M25661 Stiffness of right knee, not elsewhere classified: Secondary | ICD-10-CM

## 2021-05-05 DIAGNOSIS — M6281 Muscle weakness (generalized): Secondary | ICD-10-CM | POA: Diagnosis not present

## 2021-05-05 DIAGNOSIS — R2681 Unsteadiness on feet: Secondary | ICD-10-CM

## 2021-05-05 DIAGNOSIS — M25561 Pain in right knee: Secondary | ICD-10-CM | POA: Diagnosis not present

## 2021-05-05 DIAGNOSIS — R2689 Other abnormalities of gait and mobility: Secondary | ICD-10-CM

## 2021-05-05 NOTE — Therapy (Signed)
?OUTPATIENT PHYSICAL THERAPY LOWER EXTREMITY EVALUATION ? ? ?Patient Name: Kathryn Miller ?MRN: [id number] ?DOB:21-Sep-1965, 56 y.o., female ?Today's Date: 05/05/2021 ? ? PT End of Session - 05/05/21 2637   ? ? Visit Number 1   ? Number of Visits 6   ? Date for PT Re-Evaluation 06/16/21   ? Authorization Type [employer] $40 copay   ? PT Start Time 0845   ? PT Stop Time 0920   ? PT Time Calculation (min) 35 min   ? Activity Tolerance Patient tolerated treatment well   ? Behavior During Therapy [facility] for tasks assessed/performed   ? ?  ?  ? ?  ? ? ?Past Medical History:  ?Diagnosis Date  ? Anemia   ? Chest pain   ? Murmur, cardiac   ? ?Past Surgical History:  ?Procedure Laterality Date  ? CESAREAN SECTION  [phone]  ? TUBAL LIGATION  2004  ? ?Patient Active Problem List  ? Diagnosis Date Noted  ? Chronic right-sided low back pain without sciatica 09/17/2019  ? Morbid obesity (HCC) 04/08/2016  ? Screen for colon cancer 04/08/2016  ? HYPERTHYROIDISM 07/16/2009  ? CHALAZION 07/15/2009  ? SYSTOLIC MURMUR 07/15/2009  ? METRORRHAGIA 10/07/2008  ? BACTERIAL VAGINITIS 02/28/2008  ? HOT FLASHES 02/28/2008  ? Pain in limb 08/30/2007  ? DENTAL CARIES 08/08/2007  ? ALLERGIC RHINITIS 06/02/2007  ? OTITIS MEDIA, LEFT 03/09/2007  ? CARBUNCLE, ARM 03/09/2007  ? PLANTAR FASCIITIS 05/21/2006  ? ANEMIA-IRON DEFICIENCY 11/16/2005  ? ? ?PCP: Jake Shark, PA-C ? ?REFERRING PROVIDER: Cammy Copa, MD ? ?REFERRING DIAG: S83.411D (ICD-10-CM) - Sprain of medial collateral ligament of right knee, subsequent encounter  ? ?THERAPY DIAG:  ?Acute pain of right knee - Plan: PT plan of care cert/re-cert ? ?Muscle weakness (generalized) - Plan: PT plan of care cert/re-cert ? ?Other abnormalities of gait and mobility - Plan: PT plan of care cert/re-cert ? ?Stiffness of right knee, not elsewhere classified - Plan: PT plan of care cert/re-cert ? ?Unsteadiness on feet - Plan: PT plan of care cert/re-cert ? ?ONSET DATE: early March  2023 ? ?SUBJECTIVE:  ? ?SUBJECTIVE STATEMENT: ?Pt is a 56 y/o female who presents to OPPT s/p MCL tear about 8 weeks ago, and complains of continued burning pain and difficulty with mobility.  She was in a hinged brace initially locked, but then unlocked.  She is now PRN with brace.  ? ?PERTINENT HISTORY: ?obesity ? ?PAIN:  ?Are you having pain? Yes: NPRS scale: 5-6 currently, up to 10+, at best 5-6/10 ?Pain location: Rt knee ?Pain description: burning, throbbing, pressure ?Aggravating factors: flexion, prolonged sitting, difficulty getting into comfortable positions at night ?Relieving factors: rest, immobilization ? ?PRECAUTIONS: None ? ?WEIGHT BEARING RESTRICTIONS No ? ?FALLS:  ?Has patient fallen in last 6 months? No ? ?LIVING ENVIRONMENT: ?Lives with: lives with their family, 79 and 41 y/o children ?Lives in: House/apartment ?Stairs: No ?Has following equipment at home: Single point cane and Wheelchair (manual) ? 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 ?PLOF: Independent and Leisure: shopping, spend time with children and grandchildren ? ?PATIENT GOALS improve pain and return back to PLOF, get back to work ? ? ?OBJECTIVE:  ? ?DIAGNOSTIC FINDINGS: MRI scan does show grade 2 MCL sprain.  ? ?PATIENT SURVEYS:  ?05/05/21 FOTO 18 (predicted 49) ? ?COGNITION: ? Overall cognitive status: Within functional limits for tasks assessed   ?  ?SENSATION: ?WFL ? ?PALPATION: ?Tenderness along Rt medial knee/MCL ? ?  LE ROM: ? ?Active ROM Right ?05/05/2021 Left ?05/05/2021  ?Knee flexion 85 122  ?Knee extension -1 (seated LAQ) 4 (seated LAQ)  ? (Blank rows = not tested) ? ? ?Passive ROM Right ?05/05/2021 Left ?05/05/2021  ?Knee flexion 115   ?Knee extension 3   ? (Blank rows = not tested) ? ? ?LE MMT: ? ?MMT Right ?05/05/2021 Left ?05/05/2021  ?Knee flexion 3-/5   ?Knee extension 3-5   ? (Blank rows = not tested) ? ?GAIT: ?Distance walked: 100' ?Assistive device utilized:  has SPC, intermittent  use ?Level of assistance: Modified independence ?Comments: antalgic gait ? ? ? ?TODAY'S TREATMENT: ?05/05/21: ?See pt instructions, pt performed trial reps PRN for comprehension ?Gait: adjusted SPC height, and educated on sequencing, amb with supervision and improved gait mechanics with SPC in Lt hand ? ? ?PATIENT EDUCATION:  ?Education details: HEP ?Person educated: Patient ?Education method: Explanation, Demonstration, and Handouts ?Education comprehension: verbalized understanding, returned demonstration, and needs further education ? ? ?HOME EXERCISE PROGRAM: ?Access Code: [email or web address] ?URL: [email or web address] ?Date: 05/05/2021 ?Prepared by: Moshe Cipro ? ?Exercises ?- Supine Quad Set  - 4-5 x daily - 7 x weekly - 1 sets - 10 reps - 5 sec hold ?- Supine Heel Slides  - 4-5 x daily - 7 x weekly - 1 sets - 10 reps - 5 sec hold ?- Seated Knee Extension AROM  - 2-3 x daily - 7 x weekly - 2 sets - 10 reps - 5 sec hold ?- Seated Straight Leg Raise  - 2-3 x daily - 7 x weekly - 2 sets - 10 reps - 5 sec hold ? ?ASSESSMENT: ? ?CLINICAL IMPRESSION: ?Patient is a 56 y.o. female who was seen today for physical therapy evaluation and treatment for Rt knee pain with known MCL sprain. She demonstrates decreased strength and ROM, as well as continued pain and gait abnormalities affecting functional mobility.  She will benefit from PT to address deficits listed.  ? ? ?OBJECTIVE IMPAIRMENTS Abnormal gait, decreased balance, decreased knowledge of use of DME, decreased mobility, difficulty walking, decreased ROM, decreased strength, hypomobility, and pain.  ? ?ACTIVITY LIMITATIONS cleaning, community activity, driving, occupation, laundry, and shopping.  ? ?PERSONAL FACTORS 1 comorbidity: obesity  are also affecting patient's functional outcome.  ? ? ?REHAB POTENTIAL: Good ? ?CLINICAL DECISION MAKING: Stable/uncomplicated ? ?EVALUATION COMPLEXITY: Low ? ? ?GOALS: ?Goals reviewed with patient? Yes ? ?SHORT TERM  GOALS: Target date: 05/26/2021 ? ?Independent with initial HEP ?Goal status: INITIAL ? ?LONG TERM GOALS: Target date: 06/16/2021 ? ?Independent with final HEP ?Goal status: INITIAL ? ?2.  FOTO score improved to 49 ?Goal status: INITIAL ? ?3.  Rt knee AROM improved to 0-115 for improved mobility and function ?Goal status: INIITAL ? ?4.  Report pain < 3/10 with activity for improved function ?Goal status: INITIAL ? ?5.  Report ability to stand/walk > 2 hours with pain < 3/10 in order to work on return to work ?Goal status: INITIAL ? ?6.  Rt knee strength improved to 4/5 for improved function ?Goal status: INITIAL ? ? ?PLAN: ?PT FREQUENCY: 1x/week; recommending 2x/wk, pt requesting 1x/wk due to copay ? ?PT DURATION: 6 weeks ? ?PLANNED INTERVENTIONS: Therapeutic exercises, Therapeutic activity, Neuromuscular re-education, Balance training, Gait training, Patient/Family education, Joint mobilization, DME instructions, Aquatic Therapy, Dry Needling, Electrical stimulation, Cryotherapy, Moist heat, Taping, Ionotophoresis 4mg /ml Dexamethasone, and Manual therapy ? ?PLAN FOR NEXT SESSION: review HEP, manual/modalities PRN for pain, quad/hamstring strengthening ? ? ? , PT, DPT ?05/05/21 9:34  AM ? ? ?

## 2021-05-11 ENCOUNTER — Telehealth: Payer: Self-pay | Admitting: Orthopedic Surgery

## 2021-05-11 NOTE — Telephone Encounter (Signed)
Patient called. She said she is still in a lot of pain. Would like to know what to do? Her call back number is [phone] ?

## 2021-05-13 NOTE — Telephone Encounter (Signed)
Tried calling patient to advise per Luke's note. No answer, no VM to LM. Will try to reach patient again later.  ?

## 2021-05-13 NOTE — Telephone Encounter (Signed)
Potentially could try a knee injection to see if this will help with some of her pain.  Most of the pain seems to be coming from the [facility] but if she is having any intra-articular pathology then potentially an injection could be somewhat helpful for that but I do not think it will give her 100% relief

## 2021-05-14 NOTE — Telephone Encounter (Signed)
Tied calling again. No answer. No VM to LM ?

## 2021-05-15 ENCOUNTER — Encounter: Payer: 59 | Admitting: Physical Therapy

## 2021-05-21 ENCOUNTER — Telehealth: Payer: Self-pay

## 2021-05-21 NOTE — Telephone Encounter (Signed)
Ok to come in Friday afternoon  ok to rtw 5 24 which is essentially 3 mos from injury which is enough time in 99 percent of cases in my experience  if she feels like she cannot rtw at that time she would need to see someone else for second opinion about staying out longer

## 2021-05-21 NOTE — Telephone Encounter (Signed)
Patient called because she continues to have a lot of pain in her right knee, not being able to bear weight on it without pain. She cannot fully extend the knee. Currently doing PT. 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 The patient is asking for some direction on what she should do regarding work. She is willing to come in to be seen again if she needs to do so. CB [phone]. ?

## 2021-05-22 ENCOUNTER — Encounter: Payer: Self-pay | Admitting: Rehabilitative and Restorative Service Providers"

## 2021-05-22 ENCOUNTER — Ambulatory Visit (INDEPENDENT_AMBULATORY_CARE_PROVIDER_SITE_OTHER): Payer: 59 | Admitting: Rehabilitative and Restorative Service Providers"

## 2021-05-22 DIAGNOSIS — M25661 Stiffness of right knee, not elsewhere classified: Secondary | ICD-10-CM | POA: Diagnosis not present

## 2021-05-22 DIAGNOSIS — M25561 Pain in right knee: Secondary | ICD-10-CM | POA: Diagnosis not present

## 2021-05-22 DIAGNOSIS — M6281 Muscle weakness (generalized): Secondary | ICD-10-CM | POA: Diagnosis not present

## 2021-05-22 DIAGNOSIS — R2689 Other abnormalities of gait and mobility: Secondary | ICD-10-CM | POA: Diagnosis not present

## 2021-05-22 NOTE — Telephone Encounter (Signed)
Tried calling patient. No answer. Will try to reach patient again next week. ?

## 2021-05-22 NOTE — Therapy (Signed)
?OUTPATIENT PHYSICAL THERAPY TREATMENT NOTE ? ? ?Patient Name: Kathryn Miller ?MRN: [id number] ?DOB:November 22, 1965, 56 y.o., female ?Today's Date: 05/22/2021 ? ?PCP: Irene Pap, PA-C ?REFERRING PROVIDER: Meredith Pel, MD ? ?END OF SESSION:  ? PT End of Session - 05/22/21 0844   ? ? Visit Number 2   ? Number of Visits 6   ? Date for PT Re-Evaluation 06/16/21   ? Authorization Type [employer] $40 copay   ? PT Start Time [phone]   ? PT Stop Time [phone]   ? PT Time Calculation (min) 49 min   ? Activity Tolerance Patient tolerated treatment well;No increased pain   ? Behavior During Therapy [facility] for tasks assessed/performed   ? ?  ?  ? ?  ? ? ?Past Medical History:  ?Diagnosis Date  ? Anemia   ? Chest pain   ? Murmur, cardiac   ? ?Past Surgical History:  ?Procedure Laterality Date  ? CESAREAN SECTION  [phone]  ? TUBAL LIGATION  2004  ? ?Patient Active Problem List  ? Diagnosis Date Noted  ? Chronic right-sided low back pain without sciatica 09/17/2019  ? Morbid obesity ([place]) 04/08/2016  ? Screen for colon cancer 04/08/2016  ? HYPERTHYROIDISM 07/16/2009  ? CHALAZION 07/15/2009  ? SYSTOLIC MURMUR 82/64/1583  ? METRORRHAGIA 10/07/2008  ? BACTERIAL VAGINITIS 02/28/2008  ? HOT FLASHES 02/28/2008  ? Pain in limb 08/30/2007  ? DENTAL CARIES 08/08/2007  ? ALLERGIC RHINITIS 06/02/2007  ? OTITIS MEDIA, LEFT 03/09/2007  ? [place], [place] 03/09/2007  ? PLANTAR FASCIITIS 05/21/2006  ? ANEMIA-IRON DEFICIENCY 11/16/2005  ? ? ?REFERRING DIAG: S83.411D (ICD-10-CM) - Sprain of medial collateral ligament of right knee, subsequent encounter  ? ?THERAPY DIAG:  ?Other abnormalities of gait and mobility ? ?Stiffness of right knee, not elsewhere classified ? ?Acute pain of right knee ? ?Muscle weakness (generalized) ? ?PERTINENT HISTORY: Obesity ? ?PRECAUTIONS: None ? ?SUBJECTIVE: Kathryn Miller notes good early HEP compliance.  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 ? ?PAIN:   ?Are you having pain? Yes: NPRS scale: 0-6/10 ?Pain location: R medial and posterior knee ?Pain description: Tight and throbbing ?Aggravating factors: Prolonged WB or postures ?Relieving factors: Ice, rest and exercises ? ? ?OBJECTIVE: (objective measures completed at initial evaluation unless otherwise dated) ?OBJECTIVE:  ?  ?DIAGNOSTIC FINDINGS: MRI scan does show grade 2 MCL sprain.  ?  ?PATIENT SURVEYS:  ?05/05/21 FOTO 18 (predicted 49) ?  ?COGNITION: ?          Overall cognitive status: Within functional limits for tasks assessed               ?           ?SENSATION: ?WFL ?  ?PALPATION: ?Tenderness along Rt medial knee/MCL ?  ?LE ROM: ?  ?Active ROM Right ?05/05/2021 Left ?05/05/2021 Right 05/22/2021  ?Knee flexion 85 122 110  ?Knee extension -1 (seated LAQ) 4 (seated LAQ) Full  ? (Blank rows = not tested) ?  ?  ?Passive ROM Right ?05/05/2021 Left ?05/05/2021  ?Knee flexion 115    ?Knee extension 3    ? (Blank rows = not tested) ?  ?  ?LE MMT: ?  ?MMT Right ?05/05/2021 Left ?05/05/2021  ?Knee flexion 3-/5    ?Knee extension 3-5    ? (Blank rows = not tested) ?  ?GAIT: ?Distance walked: 100' ?Assistive device utilized:  has SPC, intermittent use ?Level of assistance:  Modified independence ?Comments: antalgic gait ?  ?  ?  ?TODAY'S TREATMENT: ?05/21/2021: ?Recumbent Bike Seat 4 for 8 minutes AAROM ?Quadriceps sets 3 sets of 10 for 5 seconds ?Supine knee flexion 10X 5 seconds ?Seated straight leg raises 3 sets of 5 for 3 seconds (slow eccentrics) ? ?Vasopneumatic Medium R knee 10 minutes 34* ? ? ?05/05/21: ?See pt instructions, pt performed trial reps PRN for comprehension ?Gait: adjusted SPC height, and educated on sequencing, amb with supervision and improved gait mechanics with SPC in Lt hand ?  ?  ?PATIENT EDUCATION:  ?Education details: HEP ?Person educated: Patient ?Education method: Explanation, Demonstration, and Handouts ?Education comprehension: verbalized understanding, returned demonstration, and needs further  education ?  ?  ?HOME EXERCISE PROGRAM: ? Access Code: [email or web address] ?URL: [email or web address] ?Date: 05/22/2021 ?Prepared by: Vista Mink ? ?Exercises ?- Supine Quad Set  - 3-4 x daily - 7 x weekly - 2-3 sets - 10 reps - 5 sec hold ?- Supine Heel Slides  - 2-3 x daily - 7 x weekly - 1 sets - 10 reps - 5 sec hold ?- Seated Straight Leg Raise  - 2-3 x daily - 7 x weekly - 3-5 sets - 5 reps - 5 sec hold ?ASSESSMENT: ?  ?CLINICAL IMPRESSION: ?Kathryn Miller is doing a great job with her AROM.  Quadriceps strength and functional deficits will benefit from the recommended POC. ?  ?  ?OBJECTIVE IMPAIRMENTS Abnormal gait, decreased balance, decreased knowledge of use of DME, decreased mobility, difficulty walking, decreased ROM, decreased strength, hypomobility, and pain.  ?  ?ACTIVITY LIMITATIONS cleaning, community activity, driving, occupation, laundry, and shopping.  ?  ?PERSONAL FACTORS 1 comorbidity: obesity  are also affecting patient's functional outcome.  ?  ?  ?REHAB POTENTIAL: Good ?  ?CLINICAL DECISION MAKING: Stable/uncomplicated ?  ?EVALUATION COMPLEXITY: Low ?  ?  ?GOALS: ?Goals reviewed with patient? Yes ?  ?SHORT TERM GOALS: Target date: 05/26/2021 ?  ?Independent with initial HEP ?Goal status: MET 05/22/2021 ?  ?LONG TERM GOALS: Target date: 06/16/2021 ?  ?Independent with final HEP ?Goal status: INITIAL ?  ?2.  FOTO score improved to 49 ?Goal status: INITIAL ?  ?3.  Rt knee AROM improved to 0-115 for improved mobility and function ?Goal status: On Going 0-110 05/22/2021 ?  ?4.  Report pain < 3/10 with activity for improved function ?Goal status: INITIAL ?  ?5.  Report ability to stand/walk > 2 hours with pain < 3/10 in order to work on return to work ?Goal status: INITIAL ?  ?6.  Rt knee strength improved to 4/5 for improved function ?Goal status: INITIAL ?  ?  ?PLAN: ?PT FREQUENCY: 1x/week; recommending 2x/wk, pt requesting 1x/wk due to copay ?  ?PT DURATION: 6 weeks ?  ?PLANNED INTERVENTIONS: Therapeutic  exercises, Therapeutic activity, Neuromuscular re-education, Balance training, Gait training, Patient/Family education, Joint mobilization, DME instructions, Aquatic Therapy, Dry Needling, Electrical stimulation, Cryotherapy, Moist heat, Taping, Ionotophoresis 37m/ml Dexamethasone, and Manual therapy ?  ?PLAN FOR NEXT SESSION: Continue quadriceps strength, functional work and flexion AROM progressions. ? ? ? ? ?RFarley Ly PT, MPT ?05/22/2021, 4:25 PM ? ?   ?

## 2021-05-25 NOTE — Telephone Encounter (Signed)
Pt called again

## 2021-05-26 NOTE — Telephone Encounter (Signed)
Should patient call back-needs to schedule appt to discuss and have repeat evaluation with either Dr August Saucer or Franky Macho ?

## 2021-05-26 NOTE — Telephone Encounter (Signed)
Tried calling again-no answer-went straight to voicemail.  ?

## 2021-05-29 ENCOUNTER — Ambulatory Visit (INDEPENDENT_AMBULATORY_CARE_PROVIDER_SITE_OTHER): Payer: 59 | Admitting: Orthopedic Surgery

## 2021-05-29 ENCOUNTER — Encounter: Payer: Self-pay | Admitting: Rehabilitative and Restorative Service Providers"

## 2021-05-29 ENCOUNTER — Encounter: Payer: Self-pay | Admitting: Orthopedic Surgery

## 2021-05-29 ENCOUNTER — Ambulatory Visit (INDEPENDENT_AMBULATORY_CARE_PROVIDER_SITE_OTHER): Payer: 59 | Admitting: Rehabilitative and Restorative Service Providers"

## 2021-05-29 DIAGNOSIS — M25561 Pain in right knee: Secondary | ICD-10-CM

## 2021-05-29 DIAGNOSIS — S83411D Sprain of medial collateral ligament of right knee, subsequent encounter: Secondary | ICD-10-CM | POA: Diagnosis not present

## 2021-05-29 DIAGNOSIS — M6281 Muscle weakness (generalized): Secondary | ICD-10-CM

## 2021-05-29 DIAGNOSIS — M25661 Stiffness of right knee, not elsewhere classified: Secondary | ICD-10-CM

## 2021-05-29 DIAGNOSIS — R2689 Other abnormalities of gait and mobility: Secondary | ICD-10-CM

## 2021-05-29 NOTE — Progress Notes (Signed)
? ?Office Visit Note ?  ?Patient: Kathryn Miller           ?Date of Birth: Aug 27, 1965           ?MRN: [id number] ?Visit Date: 05/29/2021 ?Requested by: Irene Pap, PA-C ?[address] ?[place],  [place] [address] ?PCP: Irene Pap, PA-C ? ?Subjective: ?Chief Complaint  ?Patient presents with  ? Right Knee - Follow-up  ? ? ?HPI: Kathryn Miller is a 56 year old patient here for follow-up of right knee MCL sprain.  Reports continued pain but she would like to return to work because she feels like she is improved enough that she can perform her work duties.  She has been going to physical therapy.  She works as a Quarry manager.  No mechanical symptoms. ?             ?ROS: All systems reviewed are negative as they relate to the chief complaint within the history of present illness.  Patient denies  fevers or chills. ? ? ?Assessment & Plan: ?Visit Diagnoses:  ?1. Sprain of medial collateral ligament of right knee, subsequent encounter   ? ? ?Plan: Impression is multiple months out from right knee MCL sprain.  She is stable today with no effusion.  Excellent range of motion and improving strength.  Okay to return to work Monday.  Follow-up with [place] as needed. ? ?Follow-Up Instructions: Return if symptoms worsen or fail to improve.  ? ?Orders:  ?No orders of the defined types were placed in this encounter. ? ?No orders of the defined types were placed in this encounter. ? ? ? ? Procedures: ?No procedures performed ? ? ?Clinical Data: ?No additional findings. ? ?Objective: ?Vital Signs: There were no vitals taken for this visit. ? ?Physical Exam:  ? ?Constitutional: Patient appears well-developed ?HEENT:  ?Head: Normocephalic ?Eyes:EOM are normal ?Neck: Normal range of motion ?Cardiovascular: Normal rate ?Pulmonary/chest: Effort normal ?Neurologic: Patient is alert ?Skin: Skin is warm ?Psychiatric: Patient has normal mood and affect ? ? ?Ortho Exam: Ortho exam demonstrates normal gait and alignment.  No effusion right knee.   Range of motion full.  Quad strength improving and generally symmetric with the left-hand side.  MCL stable to valgus stress at 0 and 30 degrees with only minimal tenderness at the MCL attachment. ? ?Specialty Comments:  ?No specialty comments available. ? ?Imaging: ?No results found. ? ? ?PMFS History: ?Patient Active Problem List  ? Diagnosis Date Noted  ? Chronic right-sided low back pain without sciatica 09/17/2019  ? Morbid obesity ([place]) 04/08/2016  ? Screen for colon cancer 04/08/2016  ? HYPERTHYROIDISM 07/16/2009  ? CHALAZION 07/15/2009  ? SYSTOLIC MURMUR [id number]  ? METRORRHAGIA 10/07/2008  ? BACTERIAL VAGINITIS 02/28/2008  ? HOT FLASHES 02/28/2008  ? Pain in limb 08/30/2007  ? DENTAL CARIES 08/08/2007  ? ALLERGIC RHINITIS 06/02/2007  ? OTITIS MEDIA, LEFT 03/09/2007  ? [place], [place] 03/09/2007  ? PLANTAR FASCIITIS 05/21/2006  ? ANEMIA-IRON DEFICIENCY 11/16/2005  ? ?Past Medical History:  ?Diagnosis Date  ? Anemia   ? Chest pain   ? Murmur, cardiac   ?  ?Family History  ?Problem Relation Age of Onset  ? Breast cancer Mother   ? Diabetes Mother   ? Hypertension Mother   ?  ?Past Surgical History:  ?Procedure Laterality Date  ? CESAREAN SECTION  [phone]  ? TUBAL LIGATION  2004  ? ?Social History  ? ?Occupational History  ? Occupation: unemployed  ?Tobacco Use  ? Smoking status: Never  ?  Smokeless tobacco: Never  ?Vaping Use  ? Vaping Use: Never used  ?Substance and Sexual Activity  ? Alcohol use: No  ? Drug use: No  ? Sexual activity: Not on file  ? ? ? ? ? ?

## 2021-05-29 NOTE — Therapy (Addendum)
?OUTPATIENT PHYSICAL THERAPY TREATMENT NOTE ? ?PHYSICAL THERAPY DISCHARGE SUMMARY ? ?Visits from Start of Care: 3 ? ?Current functional level related to goals / functional outcomes: ?See note ?  ?Remaining deficits: ?Quadriceps strength ?  ?Education / Equipment: ?Updated HEP  ? ?Patient agrees to discharge. Patient goals were partially met. Patient is being discharged due to  Kathryn Miller's choice due to returning to work and comfort with her HEP.  ? ?Patient Name: Kathryn Miller ?MRN: [id number] ?DOB:09-06-1965, 56 y.o., female ?Today's Date: 05/29/2021 ? ?PCP: Irene Pap, PA-C ?REFERRING PROVIDER: Meredith Pel, MD ? ?END OF SESSION:  ? PT End of Session - 05/29/21 0805   ? ? Visit Number 3   ? Number of Visits 6   ? Date for PT Re-Evaluation 06/16/21   ? Authorization Type [employer] $40 copay   ? PT Start Time 0802   ? PT Stop Time 0845   ? PT Time Calculation (min) 43 min   ? Activity Tolerance Patient tolerated treatment well;No increased pain   ? Behavior During Therapy [facility] for tasks assessed/performed   ? ?  ?  ? ?  ? ? ? ?Past Medical History:  ?Diagnosis Date  ? Anemia   ? Chest pain   ? Murmur, cardiac   ? ?Past Surgical History:  ?Procedure Laterality Date  ? CESAREAN SECTION  [phone]  ? TUBAL LIGATION  2004  ? ?Patient Active Problem List  ? Diagnosis Date Noted  ? Chronic right-sided low back pain without sciatica 09/17/2019  ? Morbid obesity ([place]) 04/08/2016  ? Screen for colon cancer 04/08/2016  ? HYPERTHYROIDISM 07/16/2009  ? CHALAZION 07/15/2009  ? SYSTOLIC MURMUR 09/32/6712  ? METRORRHAGIA 10/07/2008  ? BACTERIAL VAGINITIS 02/28/2008  ? HOT FLASHES 02/28/2008  ? Pain in limb 08/30/2007  ? DENTAL CARIES 08/08/2007  ? ALLERGIC RHINITIS 06/02/2007  ? OTITIS MEDIA, LEFT 03/09/2007  ? Nellie, Stem 03/09/2007  ? PLANTAR FASCIITIS 05/21/2006  ? ANEMIA-IRON DEFICIENCY 11/16/2005  ? ? ?REFERRING DIAG: S83.411D (ICD-10-CM) - Sprain of medial collateral ligament of right knee, subsequent  encounter  ? ?THERAPY DIAG:  ?Other abnormalities of gait and mobility ? ?Stiffness of right knee, not elsewhere classified ? ?Acute pain of right knee ? ?Muscle weakness (generalized) ? ?PERTINENT HISTORY: Obesity ? ?PRECAUTIONS: None ? ?SUBJECTIVE: [name] notes good early HEP compliance.  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 ? ?PAIN:  ?Are you having pain? Yes: NPRS scale: 0-4/10 ?Pain location: R medial and posterior knee ?Pain description: Tight and throbbing ?Aggravating factors: Prolonged WB or postures ?Relieving factors: Ice, rest and exercises ? ? ?OBJECTIVE: (objective measures completed at initial evaluation unless otherwise dated) ?OBJECTIVE:  ?  ?DIAGNOSTIC FINDINGS: MRI scan does show grade 2 MCL sprain.  ?  ?PATIENT SURVEYS:  ?05/29/2021 FOTO 46 ?05/05/21 FOTO 18 (predicted 49) ?  ?COGNITION: ?          Overall cognitive status: Within functional limits for tasks assessed               ?           ?SENSATION: ?WFL ?  ?PALPATION: ?Tenderness along Rt medial knee/MCL ?  ?LE ROM: ?  ?Active ROM Right ?05/05/2021 Left ?05/05/2021 Right 05/22/2021 Right 05/29/2021  ?Knee flexion 85 122 110 112  ?Knee extension -1 (seated LAQ) 4 (seated LAQ) Full Full  ? (Blank rows = not tested) ?  ?  ?  Passive ROM Right ?05/05/2021 Left ?05/05/2021  ?Knee flexion 115    ?Knee extension 3    ? (Blank rows = not tested) ?  ?  ?LE MMT: ?  ?MMT Right ?05/05/2021 Left ?05/05/2021 L/R in pounds 05/29/2021  ?Knee flexion 3-/5     ?Knee extension 3-5   63.3/26.0  ? (Blank rows = not tested) ?  ?GAIT: ?Distance walked: 100' ?Assistive device utilized:  has SPC, intermittent use ?Level of assistance: Modified independence ?Comments: antalgic gait ?  ?  ?  ?TODAY'S TREATMENT: ?05/29/2021: ?Recumbent Bike Seat 4 for 8 minutes AAROM ?Seated straight leg raises 3 sets of 5 for 3 seconds (slow eccentrics) ?Knee extension machine 90-40 degrees with slow eccentrics 2 sets of 10 at  10# ? ? ?Functional Activities: Leg Press double leg 100# and R leg only 25# 15X each slow eccentrics ? ? ?05/21/2021: ?Recumbent Bike Seat 4 for 8 minutes AAROM ?Quadriceps sets 3 sets of 10 for 5 seconds ?Supine knee flexion 10X 5 seconds ?Seated straight leg raises 3 sets of 5 for 3 seconds (slow eccentrics) ? ?Vasopneumatic Medium R knee 10 minutes 34* ? ? ?05/05/21: ?See pt instructions, pt performed trial reps PRN for comprehension ?Gait: adjusted SPC height, and educated on sequencing, amb with supervision and improved gait mechanics with SPC in Lt hand ?  ?  ?PATIENT EDUCATION:  ?Education details: HEP ?Person educated: Patient ?Education method: Explanation, Demonstration, and Handouts ?Education comprehension: verbalized understanding, returned demonstration, and needs further education ?  ?  ?HOME EXERCISE PROGRAM: ? Access Code: [email or web address] ?URL: [email or web address] ?Date: 05/22/2021 ?Prepared by: Vista Mink ? ?Exercises ?- Supine Quad Set  - 3-4 x daily - 7 x weekly - 2-3 sets - 10 reps - 5 sec hold ?- Supine Heel Slides  - 2-3 x daily - 7 x weekly - 1 sets - 10 reps - 5 sec hold ?- Seated Straight Leg Raise  - 2-3 x daily - 7 x weekly - 3-5 sets - 5 reps - 5 sec hold ?ASSESSMENT: ?  ?CLINICAL IMPRESSION: ?[name] is making subjective, objective and functional progress since starting PT.  She is very motivated to get back to work, although her 57% quadriceps strength (involved vs uninvolved) will benefit from continued work for return to full-function.  She will benefit from continuing her current plan of care for another 4 weeks. ?  ?  ?OBJECTIVE IMPAIRMENTS Abnormal gait, decreased balance, decreased knowledge of use of DME, decreased mobility, difficulty walking, decreased ROM, decreased strength, hypomobility, and pain.  ?  ?ACTIVITY LIMITATIONS cleaning, community activity, driving, occupation, laundry, and shopping.  ?  ?PERSONAL FACTORS 1 comorbidity: obesity  are also affecting  patient's functional outcome.  ?  ?  ?REHAB POTENTIAL: Good ?  ?CLINICAL DECISION MAKING: Stable/uncomplicated ?  ?EVALUATION COMPLEXITY: Low ?  ?  ?GOALS: ?Goals reviewed with patient? Yes ?  ?SHORT TERM GOALS: Target date: 05/26/2021 ?  ?Independent with initial HEP ?Goal status: MET 05/22/2021 ?  ?LONG TERM GOALS: Target date: 06/16/2021 ?  ?Independent with final HEP ?Goal status: On Going 05/29/2021 ?  ?2.  FOTO score improved to 49 ?Goal status: 46 on 05/29/2021 ?  ?3.  Rt knee AROM improved to 0-115 for improved mobility and function ?Goal status: On Going 0-112 05/29/2021 ?  ?4.  Report pain < 3/10 with activity for improved function ?Goal status: 0-4/10 05/29/2021 ?  ?5.  Report ability to stand/walk > 2 hours with pain < 3/10 in order to work on  return to work ?Goal status: On Going 05/29/2021 ?  ?6.  Rt knee strength improved to 4/5 for improved function ?Goal status: 41% 05/29/2021 ?  ?  ?PLAN: ?PT FREQUENCY: 1x/week; recommending 2x/wk, pt requesting 1x/wk due to copay ?  ?PT DURATION: Another 4-6 weeks ?  ?PLANNED INTERVENTIONS: Therapeutic exercises, Therapeutic activity, Neuromuscular re-education, Balance training, Gait training, Patient/Family education, Joint mobilization, DME instructions, Aquatic Therapy, Dry Needling, Electrical stimulation, Cryotherapy, Moist heat, Taping, Ionotophoresis 62m/ml Dexamethasone, and Manual therapy ?  ?PLAN FOR NEXT SESSION: Continue quadriceps strength emphasis with functional progressions as appropriate. ? ? ? ? ?RFarley Ly PT, MPT ?05/29/2021, 11:21 AM ? ?   ?

## 2021-06-01 ENCOUNTER — Encounter: Payer: 59 | Admitting: Rehabilitative and Restorative Service Providers"

## 2021-06-01 ENCOUNTER — Telehealth: Payer: Self-pay | Admitting: Rehabilitative and Restorative Service Providers"

## 2021-06-01 NOTE — Telephone Encounter (Signed)
Spoke with Burna Mortimer.  She is returning to work and will continue PT independently. ?

## 2021-06-05 ENCOUNTER — Encounter: Payer: 59 | Admitting: Rehabilitative and Restorative Service Providers"

## 2021-06-11 ENCOUNTER — Encounter: Payer: 59 | Admitting: Rehabilitative and Restorative Service Providers"

## 2021-06-12 ENCOUNTER — Encounter: Payer: 59 | Admitting: Physician Assistant

## 2021-06-25 ENCOUNTER — Encounter: Payer: 59 | Admitting: Physician Assistant

## 2021-06-30 ENCOUNTER — Encounter: Payer: Self-pay | Admitting: Family Medicine

## 2021-07-08 ENCOUNTER — Ambulatory Visit
Admission: EM | Admit: 2021-07-08 | Discharge: 2021-07-08 | Disposition: A | Discharge status: Home / Self Care | Payer: 59 | Attending: Emergency Medicine | Admitting: Emergency Medicine

## 2021-07-08 ENCOUNTER — Ambulatory Visit: Admit: 2021-07-08 | Payer: 59

## 2021-07-08 DIAGNOSIS — K047 Periapical abscess without sinus: Secondary | ICD-10-CM | POA: Diagnosis not present

## 2021-07-08 DIAGNOSIS — K0500 Acute gingivitis, plaque induced: Secondary | ICD-10-CM | POA: Diagnosis not present

## 2021-07-08 MED ORDER — CHLORHEXIDINE GLUCONATE 0.12 % MT SOLN: 15.0000 mL | Freq: Two times a day (BID) | OROMUCOSAL | 0 refills | Status: AC

## 2021-07-08 MED ORDER — PENICILLIN V POTASSIUM 500 MG PO TABS: 500.0000 mg | ORAL_TABLET | Freq: Three times a day (TID) | ORAL | 0 refills | Status: AC

## 2021-07-08 NOTE — ED Provider Notes (Signed)
[facility]    CSN: [id number] Arrival date & time: 07/08/21  1836    HISTORY   Chief Complaint  Patient presents with   Abscess   HPI Kathryn Miller is a 56 y.o. female. Patient presents to urgent care complaining of generalized mouth pain.  Patient states she is afraid she has an abscessed bottom front tooth.  Patient states she is also had pain in all of her gums for the past 3 days, states she has been unable to eat anything, has been able to tolerate fluids but only barely so.  Patient states she has scheduled an appointment to see an a dentist on July 12, states she is here today to see if we can help her with pain relief..  The history is provided by the patient.   Past Medical History:  Diagnosis Date   Anemia    Chest pain    Murmur, cardiac    Patient Active Problem List   Diagnosis Date Noted   Chronic right-sided low back pain without sciatica 09/17/2019   Morbid obesity ([place]) 04/08/2016   Screen for colon cancer 04/08/2016   HYPERTHYROIDISM 07/16/2009   CHALAZION [id number]   SYSTOLIC MURMUR [id number]   METRORRHAGIA 10/07/2008   BACTERIAL VAGINITIS 02/28/2008   HOT FLASHES 02/28/2008   Pain in limb 08/30/2007   DENTAL CARIES 08/08/2007   ALLERGIC RHINITIS 06/02/2007   OTITIS MEDIA, LEFT 03/09/2007   CARBUNCLE, ARM 03/09/2007   PLANTAR FASCIITIS 05/21/2006   ANEMIA-IRON DEFICIENCY 11/16/2005   Past Surgical History:  Procedure Laterality Date   CESAREAN SECTION  [phone]   TUBAL LIGATION  2004   OB History   No obstetric history on file.    Home Medications    Prior to Admission medications   Medication Sig Start Date End Date Taking? Authorizing Provider  chlorhexidine (PERIDEX) 0.12 % solution Use as directed 15 mLs in the mouth or throat 2 (two) times daily. 07/08/21  Yes Lynden Oxford Scales, PA-C  penicillin v potassium (VEETID) 500 MG tablet Take 1 tablet (500 mg total) by mouth 3 (three) times daily for 14 days.  07/08/21 07/22/21 Yes Lynden Oxford Scales, PA-C   Family History Family History  Problem Relation Age of Onset   Breast cancer Mother    Diabetes Mother    Hypertension Mother    Social History Social History   Tobacco Use   Smoking status: Never   Smokeless tobacco: Never  Vaping Use   Vaping Use: Never used  Substance Use Topics   Alcohol use: No   Drug use: No   Allergies   Codeine  Review of Systems Review of Systems Pertinent findings noted in history of present illness.   Physical Exam Triage Vital Signs ED Triage Vitals  Enc Vitals Group     BP 11/14/20 0827 (!) 147/82     Pulse Rate 11/14/20 0827 72     Resp 11/14/20 0827 18     Temp 11/14/20 0827 98.3 F (36.8 C)     Temp Source 11/14/20 0827 Oral     SpO2 11/14/20 0827 98 %     Weight --      Height --      Head Circumference --      Peak Flow --      Pain Score 11/14/20 0826 5     Pain Loc --      Pain Edu? --      Excl. in [place]? --   No data  found.  Updated Vital Signs BP 116/75   Pulse 66   Temp 98.5 F (36.9 C)   Resp 19   SpO2 96%   Physical Exam Vitals and nursing note reviewed.  Constitutional:      General: She is not in acute distress.    Appearance: Normal appearance.  HENT:     Head: Normocephalic and atraumatic.     Right Ear: Hearing, tympanic membrane, ear canal and external ear normal.     Left Ear: Hearing, tympanic membrane, ear canal and external ear normal.     Nose: Nose normal.     Mouth/Throat:     Lips: Pink.     Mouth: Mucous membranes are moist.     Dentition: Abnormal dentition. Does not have dentures. Dental tenderness, gingival swelling, dental caries, dental abscesses and gum lesions present.     Tongue: No lesions. Tongue does not deviate from midline.     Palate: No mass and lesions.     Pharynx: Oropharynx is clear. Uvula midline. No pharyngeal swelling, oropharyngeal exudate, posterior oropharyngeal erythema or uvula swelling.     Tonsils: No tonsillar  exudate. 0 on the right. 0 on the left.  Eyes:     Pupils: Pupils are equal, round, and reactive to light.  Cardiovascular:     Rate and Rhythm: Normal rate and regular rhythm.     Heart sounds: Normal heart sounds, S1 normal and S2 normal. Heart sounds not distant. No murmur heard. Pulmonary:     Effort: Pulmonary effort is normal.     Breath sounds: Normal breath sounds.  Musculoskeletal:        General: Normal range of motion.     Cervical back: Full passive range of motion without pain, normal range of motion and neck supple.  Lymphadenopathy:     Cervical: No cervical adenopathy.  Skin:    General: Skin is warm and dry.  Neurological:     General: No focal deficit present.     Mental Status: She is alert and oriented to person, place, and time. Mental status is at baseline.  Psychiatric:        Mood and Affect: Mood normal.        Behavior: Behavior normal.        Thought Content: Thought content normal.        Judgment: Judgment normal.     Visual Acuity Right Eye Distance:   Left Eye Distance:   Bilateral Distance:    Right Eye Near:   Left Eye Near:    Bilateral Near:     UC Couse / Diagnostics / Procedures:    EKG  Radiology No results found.  Procedures Procedures (including critical care time)  UC Diagnoses / Final Clinical Impressions(s)   I have reviewed the triage vital signs and the nursing notes.  Pertinent labs & imaging results that were available during my care of the patient were reviewed by me and considered in my medical decision making (see chart for details).   Final diagnoses:  Dental abscess  Gingivitis, acute  Patient provided with prescription for Pen-VK and Peridex mouthwash.  Patient encouraged to be sure to keep her appointment with her dentist on July 12.  Patient advised to follow-up with the emergency room with worsening symptoms.   ED Prescriptions     Medication Sig Dispense Auth. Provider   chlorhexidine (PERIDEX) 0.12 %  solution Use as directed 15 mLs in the mouth or throat 2 (two) times daily.  120 mL Theadora Rama Scales, PA-C   penicillin v potassium (VEETID) 500 MG tablet Take 1 tablet (500 mg total) by mouth 3 (three) times daily for 14 days. 42 tablet Theadora Rama Scales, PA-C      PDMP not reviewed this encounter.  Pending results:  Labs Reviewed - No data to display  Medications Ordered in UC: Medications - No data to display  Disposition Upon Discharge:  Condition: stable for discharge home Home: take medications as prescribed; routine discharge instructions as discussed; follow up as advised.  Patient presented with an acute illness with associated systemic symptoms and significant discomfort requiring urgent management. In my opinion, this is a condition that a prudent lay person (someone who possesses an average knowledge of health and medicine) may potentially expect to result in complications if not addressed urgently such as respiratory distress, impairment of bodily function or dysfunction of bodily organs.   Routine symptom specific, illness specific and/or disease specific instructions were discussed with the patient and/or caregiver at length.   As such, the patient has been evaluated and assessed, work-up was performed and treatment was provided in alignment with urgent care protocols and evidence based medicine.  Patient/parent/caregiver has been advised that the patient may require follow up for further testing and treatment if the symptoms continue in spite of treatment, as clinically indicated and appropriate.  If the patient was tested for COVID-19, Influenza and/or RSV, then the patient/parent/guardian was advised to isolate at home pending the results of his/her diagnostic coronavirus test and potentially longer if they're positive. I have also advised pt that if his/her COVID-19 test returns positive, it's recommended to self-isolate for at least 10 days after symptoms first  appeared AND until fever-free for 24 hours without fever reducer AND other symptoms have improved or resolved. Discussed self-isolation recommendations as well as instructions for household member/close contacts as per the [facility] and  [facility], and also gave patient the COVID packet with this information.  Patient/parent/caregiver has been advised to return to the [facility] or PCP in 3-5 days if no better; to PCP or the Emergency Department if new signs and symptoms develop, or if the current signs or symptoms continue to change or worsen for further workup, evaluation and treatment as clinically indicated and appropriate  The patient will follow up with their current PCP if and as advised. If the patient does not currently have a PCP we will assist them in obtaining one.   The patient may need specialty follow up if the symptoms continue, in spite of conservative treatment and management, for further workup, evaluation, consultation and treatment as clinically indicated and appropriate.  Patient/parent/caregiver verbalized understanding and agreement of plan as discussed.  All questions were addressed during visit.  Please see discharge instructions below for further details of plan.  Discharge Instructions:   Discharge Instructions      To treat dental abscess, please begin penicillin, 1 tablet 3 times daily for a full 14-day course.  Please be sure you finish the entire prescription for best results.  Incomplete treatment may result in worsening infection which may require more aggressive antibiotic therapy.  To treat gingivitis, please begin chlorhexidine mouth rinses, swish and spit 15 mL of solution twice daily until you see your dentist.  Thank you for visiting urgent care today.    This office note has been dictated using Teaching laboratory technician.  Unfortunately, and despite my best efforts, this method of dictation can sometimes lead to occasional typographical or grammatical  errors.   I apologize in advance if this occurs.     Theadora Rama Scales, PA-C 07/08/21 1944

## 2021-07-08 NOTE — Discharge Instructions (Signed)
To treat dental abscess, please begin penicillin, 1 tablet 3 times daily for a full 14-day course.  Please be sure you finish the entire prescription for best results.  Incomplete treatment may result in worsening infection which may require more aggressive antibiotic therapy.  To treat gingivitis, please begin chlorhexidine mouth rinses, swish and spit 15 mL of solution twice daily until you see your dentist.  Thank you for visiting urgent care today.

## 2021-07-08 NOTE — ED Triage Notes (Signed)
Pt presents with generalized mouth pain. Concern for abscess tooth. Reports pain x 3 days.

## 2021-09-04 ENCOUNTER — Other Ambulatory Visit: Payer: Self-pay | Admitting: Surgical

## 2021-11-06 ENCOUNTER — Ambulatory Visit
Admission: RE | Admit: 2021-11-06 | Discharge: 2021-11-06 | Disposition: A | Discharge status: Home / Self Care | Payer: Commercial Managed Care - HMO | Source: Ambulatory Visit | Attending: Internal Medicine | Admitting: Internal Medicine

## 2021-11-06 ENCOUNTER — Other Ambulatory Visit: Payer: Self-pay | Admitting: Internal Medicine

## 2021-11-06 DIAGNOSIS — M549 Dorsalgia, unspecified: Secondary | ICD-10-CM

## 2021-12-04 ENCOUNTER — Ambulatory Visit: Payer: Commercial Managed Care - HMO | Admitting: Physical Medicine and Rehabilitation

## 2021-12-04 ENCOUNTER — Encounter: Payer: Self-pay | Admitting: Physical Medicine and Rehabilitation

## 2021-12-04 VITALS — BP 132/80 | HR 60

## 2021-12-04 DIAGNOSIS — G8929 Other chronic pain: Secondary | ICD-10-CM | POA: Diagnosis not present

## 2021-12-04 DIAGNOSIS — M47899 Other spondylosis, site unspecified: Secondary | ICD-10-CM

## 2021-12-04 DIAGNOSIS — M47816 Spondylosis without myelopathy or radiculopathy, lumbar region: Secondary | ICD-10-CM | POA: Diagnosis not present

## 2021-12-04 DIAGNOSIS — M545 Low back pain, unspecified: Secondary | ICD-10-CM | POA: Diagnosis not present

## 2021-12-04 NOTE — Progress Notes (Unsigned)
Kathryn Miller - 56 y.o. female MRN [id number]  Date of birth: [birth year]  Office Visit Note: Visit Date: 12/04/2021 PCP: Pcp, No Referred by: No ref. provider found  Subjective: Chief Complaint  Patient presents with   Lower Back - Pain    Pain radiates from right side to left side, tingling radiating down right leg, recent lumbar x-rays at [facility]   HPI: Kathryn Miller is a 56 y.o. female who comes in today for evaluation of chronic, worsening and severe bilateral lower back pain with intermittent radiation to right leg. Most severe pain is bilateral lower back. Pain ongoing for several years and worsens with movement and activity, severe pain when moving from sitting to standing position. She describes pain as sore, aching and stiff, currently rates as 8 out of 10. Some relief of pain with home exercise regimen, rest and use of medications. Currently taking Naproxen and Mobic. History of formal physical therapy in the past with some relief of pain. Lumbar MRI imaging from 2022 exhibits advanced degenerative changes of the facet joints at L4-L5 and L5-S1, more pronounced on the right at L5-S1. Trace anterolisthesis of L4 on L5. No significant spinal canal stenosis or evidence of nerve impingement. Recent lumbar x-rays exhibit stable grade 1 anterolisthesis of L4 on L5 which appears to be increased from prior lumbar MRI imaging, could be positional as x-rays were taken while patient was standing, possibility of dynamic listhesis. Good relief of pain with previous facet joint injections performed in our office in February and July of 2022. Patient was scheduled for radiofrequency ablation with [place] in April of this year, however her mother became ill and was placed in nursing home. Patient reports frequent trips to [place] to help take care of her mother. She would like proceed with radiofrequency ablation at this time. Patient denies focal weakness, numbness and tingling. Patient denies recent trauma or  falls.        Review of Systems  Musculoskeletal:  Positive for back pain.  Neurological:  Negative for tingling, sensory change, focal weakness and weakness.  All other systems reviewed and are negative.  Otherwise per HPI.  Assessment & Plan: Visit Diagnoses:    ICD-10-CM   1. Chronic bilateral low back pain without sciatica  M54.50 Ambulatory referral to Physical Medicine Rehab   G89.29     2. Spondylosis without myelopathy or radiculopathy, lumbar region  M47.816 Ambulatory referral to Physical Medicine Rehab    3. Facet hypertrophy of lumbar region  M47.816 Ambulatory referral to Physical Medicine Rehab    4. Facet joint syndrome  M47.899 Ambulatory referral to Physical Medicine Rehab       Plan: Findings:  Chronic, worsening and severe bilateral lower back pain with intermittent radiation to right leg. Most severe pain to bilateral lower back. Patient continues to have severe pain despite good conservative therapies such as formal physical therapy, home exercise regimen, rest and use of medications. Patients bilateral axial back pain is consistent with facet mediated pain, there are advanced degenerative changes at the levels of L4-L5 and L5-S1, more severe on the right where there is pseudoarthrosis at L5-S1. We will continue to monitor intermittent radiation of pain to right leg, Good relief with previous facet joint injections. Next step is to perform bilateral L4-L5 and L5-S1 radiofrequency ablation under fluoroscopic guidance. We can repeat radiofrequency ablation as this procedure does typically provide pain relief for approximately 8-12 months. No red flag symptoms noted upon exam today.  Meds & Orders: No orders of the defined types were placed in this encounter.   Orders Placed This Encounter  Procedures   Ambulatory referral to Physical Medicine Rehab    Follow-up: Return for Bilateral L4-L5 and L5-S1 radiofrequency ablation.   Procedures: No procedures  performed      Clinical History: No specialty comments available.   She reports that she has never smoked. She has never used smokeless tobacco. No results for input(s): "HGBA1C", "LABURIC" in the last 8760 hours.  Objective:  VS:  HT:    WT:   BMI:     BP:132/80  HR:60bpm  TEMP: ( )  RESP:95 % Physical Exam  Ortho Exam  Imaging: No results found.  Past Medical/Family/Surgical/Social History: Medications & Allergies reviewed per EMR, new medications updated. Patient Active Problem List   Diagnosis Date Noted   Chronic right-sided low back pain without sciatica 09/17/2019   Morbid obesity ([facility]) 04/08/2016   Screen for colon cancer 04/08/2016   HYPERTHYROIDISM 07/16/2009   CHALAZION 07/15/2009   SYSTOLIC MURMUR 07/15/2009   METRORRHAGIA 10/07/2008   BACTERIAL VAGINITIS 02/28/2008   HOT FLASHES 02/28/2008   Pain in limb 08/30/2007   DENTAL CARIES 08/08/2007   ALLERGIC RHINITIS 06/02/2007   OTITIS MEDIA, LEFT 03/09/2007   CARBUNCLE, ARM 03/09/2007   PLANTAR FASCIITIS 05/21/2006   ANEMIA-IRON DEFICIENCY 11/16/2005   Past Medical History:  Diagnosis Date   Anemia    Chest pain    Murmur, cardiac    Family History  Problem Relation Age of Onset   Breast cancer Mother    Diabetes Mother    Hypertension Mother    Past Surgical History:  Procedure Laterality Date   CESAREAN SECTION  [phone]   TUBAL LIGATION  2004   Social History   Occupational History   Occupation: unemployed  Tobacco Use   Smoking status: Never   Smokeless tobacco: Never  Vaping Use   Vaping Use: Never used  Substance and Sexual Activity   Alcohol use: No   Drug use: No   Sexual activity: Not on file

## 2022-01-14 ENCOUNTER — Other Ambulatory Visit: Payer: Self-pay | Admitting: Physical Medicine and Rehabilitation

## 2022-01-14 ENCOUNTER — Encounter: Payer: Self-pay | Admitting: Physical Medicine and Rehabilitation

## 2022-01-14 MED ORDER — DIAZEPAM 5 MG PO TABS: ORAL_TABLET | ORAL | 0 refills | Status: DC

## 2022-01-21 ENCOUNTER — Ambulatory Visit: Payer: Self-pay

## 2022-01-21 ENCOUNTER — Ambulatory Visit (INDEPENDENT_AMBULATORY_CARE_PROVIDER_SITE_OTHER): Payer: Commercial Managed Care - HMO | Admitting: Physical Medicine and Rehabilitation

## 2022-01-21 DIAGNOSIS — M47816 Spondylosis without myelopathy or radiculopathy, lumbar region: Secondary | ICD-10-CM

## 2022-01-21 MED ORDER — METHYLPREDNISOLONE ACETATE 80 MG/ML IJ SUSP
80.0000 mg | Freq: Once | INTRAMUSCULAR | Status: AC
  Administered 2022-01-21: 80 mg

## 2022-01-21 NOTE — Progress Notes (Signed)
Functional Pain Scale - descriptive words and definitions  Distressing (6)    Pain is present/unable to complete most ADLs limited by pain/sleep is difficult and active distraction is only marginal. Moderate range order  Average Pain 7   +Driver, -BT, -Dye Allergies.

## 2022-01-21 NOTE — Patient Instructions (Signed)

## 2022-02-04 ENCOUNTER — Ambulatory Visit: Payer: Self-pay

## 2022-02-04 ENCOUNTER — Ambulatory Visit (INDEPENDENT_AMBULATORY_CARE_PROVIDER_SITE_OTHER): Payer: Commercial Managed Care - HMO | Admitting: Physical Medicine and Rehabilitation

## 2022-02-04 VITALS — BP 126/85 | HR 80

## 2022-02-04 DIAGNOSIS — M47816 Spondylosis without myelopathy or radiculopathy, lumbar region: Secondary | ICD-10-CM

## 2022-02-04 MED ORDER — METHYLPREDNISOLONE ACETATE 80 MG/ML IJ SUSP
80.0000 mg | Freq: Once | INTRAMUSCULAR | Status: AC
  Administered 2022-02-04: 80 mg

## 2022-02-04 NOTE — Patient Instructions (Signed)

## 2022-02-04 NOTE — Progress Notes (Signed)
Functional Pain Scale - descriptive words and definitions  Uncomfortable (3)  Pain is present but can complete all ADL's/sleep is slightly affected and passive distraction only gives marginal relief. Mild range order  Average Pain 3   +Driver, -BT, -Dye Allergies.

## 2022-02-05 ENCOUNTER — Encounter: Payer: Self-pay | Admitting: Physical Medicine and Rehabilitation

## 2022-02-06 NOTE — Procedures (Signed)
Lumbar Facet Joint Nerve Denervation  Patient: Kathryn Miller      Date of Birth: 01/03/1966 MRN: [id number] PCP: Pcp, No      Visit Date: 01/21/2022   Universal Protocol:    Date/Time: 01/20/245:02 PM  Consent Given By: the patient  Position: PRONE  Additional Comments: Vital signs were monitored before and after the procedure. Patient was prepped and draped in the usual sterile fashion. The correct patient, procedure, and site was verified.   Injection Procedure Details:   Procedure diagnoses:  1. Spondylosis without myelopathy or radiculopathy, lumbar region      Meds Administered:  Meds ordered this encounter  Medications   methylPREDNISolone acetate (DEPO-MEDROL) injection 80 mg     Laterality: Right  Location/Site:  L4-L5, L3 and L4 medial branches and L5-S1, L4 medial branch and L5 dorsal ramus  Needle: 18 ga.,  56mm active tip, 148mm RF Cannula  Needle Placement: Along juncture of superior articular process and transverse pocess  Findings:  -Comments:  Procedure Details: For each desired target nerve, the corresponding transverse process (sacral ala for the L5 dorsal rami) was identified and the fluoroscope was positioned to square off the endplates of the corresponding vertebral body to achieve a true AP midline view.  The beam was then obliqued 15 to 20 degrees and caudally tilted 15 to 20 degrees to line up a trajectory along the target nerves. The skin over the target of the junction of superior articulating process and transverse process (sacral ala for the L5 dorsal rami) was infiltrated with 61ml of 1% Lidocaine without Epinephrine.  The 18 gauge 74mm active tip outer cannula was advanced in trajectory view to the target.  This procedure was repeated for each target nerve.  Then, for all levels, the outer cannula placement was fine-tuned and the position was then confirmed with bi-planar imaging.    Test stimulation was done both at sensory and motor  levels to ensure there was no radicular stimulation. The target tissues were then infiltrated with 1 ml of 1% Lidocaine without Epinephrine. Subsequently, a percutaneous neurotomy was carried out for 90 seconds at 80 degrees Celsius.  After the completion of the lesion, 1 ml of injectate was delivered. It was then repeated for each facet joint nerve mentioned above. Appropriate radiographs were obtained to verify the probe placement during the neurotomy.   Additional Comments:  No complications occurred Dressing: 2 x 2 sterile gauze and Band-Aid    Post-procedure details: Patient was observed during the procedure. Post-procedure instructions were reviewed.  Patient left the clinic in stable condition.

## 2022-02-06 NOTE — Progress Notes (Signed)
Kathryn Miller - 57 y.o. female MRN [id number]  Date of birth: 1965-03-29  Office Visit Note: Visit Date: 02/04/2022 PCP: Pcp, No Referred by: Lorine Bears, NP  Subjective: Chief Complaint  Patient presents with   Lower Back - Pain   HPI:  Kathryn Miller is a 57 y.o. female who comes in todayfor planned radiofrequency ablation of the Left L4-5 and L5-S1 Lumbar facet joints. This would be ablation of the corresponding medial branches and/or dorsal rami.  Patient has had double diagnostic blocks with more than 50% relief.  These are documented on pain diary.  They have had chronic back pain for quite some time, more than 3 months, which has been an ongoing situation with recalcitrant axial back pain.  They have no radicular pain.  Their axial pain is worse with standing and ambulating and on exam today with facet loading.  They have had physical therapy as well as home exercise program.  The imaging noted in the chart below indicated facet pathology. Accordingly they meet all the criteria and qualification for for radiofrequency ablation and we are going to complete this today hopefully for more longer term relief as part of comprehensive management program.   ROS Otherwise per HPI.  Assessment & Plan: Visit Diagnoses:    ICD-10-CM   1. Spondylosis without myelopathy or radiculopathy, lumbar region  M47.816 XR C-ARM NO REPORT    Radiofrequency,Lumbar    methylPREDNISolone acetate (DEPO-MEDROL) injection 80 mg      Plan: No additional findings.   Meds & Orders:  Meds ordered this encounter  Medications   methylPREDNISolone acetate (DEPO-MEDROL) injection 80 mg    Orders Placed This Encounter  Procedures   Radiofrequency,Lumbar   XR C-ARM NO REPORT    Follow-up: Return for visit to requesting provider as needed.   Procedures: No procedures performed  Lumbar Facet Joint Nerve Denervation  Patient: Kathryn Miller      Date of Birth: 12-29-1965 MRN: [id number] PCP: Pcp,  No      Visit Date: 02/04/2022   Universal Protocol:    Date/Time: 01/20/245:09 PM  Consent Given By: the patient  Position: PRONE  Additional Comments: Vital signs were monitored before and after the procedure. Patient was prepped and draped in the usual sterile fashion. The correct patient, procedure, and site was verified.   Injection Procedure Details:   Procedure diagnoses:  1. Spondylosis without myelopathy or radiculopathy, lumbar region      Meds Administered:  Meds ordered this encounter  Medications   methylPREDNISolone acetate (DEPO-MEDROL) injection 80 mg     Laterality: Left  Location/Site:  L4-L5, L3 and L4 medial branches and L5-S1, L4 medial branch and L5 dorsal ramus  Needle: 18 ga.,  8mm active tip, 136mm RF Cannula  Needle Placement: Along juncture of superior articular process and transverse pocess  Findings:  -Comments:  Procedure Details: For each desired target nerve, the corresponding transverse process (sacral ala for the L5 dorsal rami) was identified and the fluoroscope was positioned to square off the endplates of the corresponding vertebral body to achieve a true AP midline view.  The beam was then obliqued 15 to 20 degrees and caudally tilted 15 to 20 degrees to line up a trajectory along the target nerves. The skin over the target of the junction of superior articulating process and transverse process (sacral ala for the L5 dorsal rami) was infiltrated with 83ml of 1% Lidocaine without Epinephrine.  The 18 gauge 46mm active tip outer  cannula was advanced in trajectory view to the target.  This procedure was repeated for each target nerve.  Then, for all levels, the outer cannula placement was fine-tuned and the position was then confirmed with bi-planar imaging.    Test stimulation was done both at sensory and motor levels to ensure there was no radicular stimulation. The target tissues were then infiltrated with 1 ml of 1% Lidocaine without  Epinephrine. Subsequently, a percutaneous neurotomy was carried out for 90 seconds at 80 degrees Celsius.  After the completion of the lesion, 1 ml of injectate was delivered. It was then repeated for each facet joint nerve mentioned above. Appropriate radiographs were obtained to verify the probe placement during the neurotomy.   Additional Comments:  No complications occurred Dressing: 2 x 2 sterile gauze and Band-Aid    Post-procedure details: Patient was observed during the procedure. Post-procedure instructions were reviewed.  Patient left the clinic in stable condition.      Clinical History: MRI LUMBAR SPINE WITHOUT CONTRAST    TECHNIQUE:  Multiplanar, multisequence MR imaging of the lumbar spine was  performed. No intravenous contrast was administered.    COMPARISON:  Plain films October 19, 2019    FINDINGS:  Segmentation: Standard.    Alignment:  Trace anterolisthesis of L4 over L5.    Vertebrae:  No fracture, evidence of discitis, or bone lesion.    Conus medullaris and cauda equina: Conus extends to the L1-2 level.  Conus and cauda equina appear normal.    Paraspinal and other soft tissues: Negative.    Disc levels:    T12-L1: No spinal canal or neural foraminal stenosis.    L1-2: No spinal canal or neural foraminal stenosis.    L2-3: No spinal canal or neural foraminal stenosis.    L3-4: Shallow disc bulge and mild facet degenerative changes without  significant spinal canal or neural foraminal stenosis.    L4-5: Disc bulge with superimposed tiny left far lateral disc  protrusion, advanced facet degenerative changes and ligamentum  flavum redundancy without significant spinal canal or neural  foraminal stenosis.    L5-S1: Small left foraminal/far lateral disc protrusion, advanced  right and moderate left facet degenerative changes. Findings result  in mild left neural foraminal narrowing. No spinal canal stenosis.    IMPRESSION:  1. Advanced  degenerative changes of the facet joints at L4-L5 and  L5-S1, more pronounced on the right at L5-S1.  2. Mild left neural foraminal narrowing at L5-S1.  3. No significant spinal canal stenosis or evidence of nerve  impingement.      Electronically Signed    By: Pedro Earls M.D.    On: 02/13/2020 16:55     Objective:  VS:  HT:    WT:   BMI:     BP:126/85  HR:80bpm  TEMP: ( )  RESP:  Physical Exam Vitals and nursing note reviewed.  Constitutional:      General: She is not in acute distress.    Appearance: Normal appearance. She is not ill-appearing.  HENT:     Head: Normocephalic and atraumatic.     Right Ear: External ear normal.     Left Ear: External ear normal.  Eyes:     Extraocular Movements: Extraocular movements intact.  Cardiovascular:     Rate and Rhythm: Normal rate.     Pulses: Normal pulses.  Pulmonary:     Effort: Pulmonary effort is normal. No respiratory distress.  Abdominal:     General:  There is no distension.     Palpations: Abdomen is soft.  Musculoskeletal:        General: Tenderness present.     Cervical back: Neck supple.     Right lower leg: No edema.     Left lower leg: No edema.     Comments: Patient has good distal strength with no pain over the greater trochanters.  No clonus or focal weakness.  Skin:    Findings: No erythema, lesion or rash.  Neurological:     General: No focal deficit present.     Mental Status: She is alert and oriented to person, place, and time.     Sensory: No sensory deficit.     Motor: No weakness or abnormal muscle tone.     Coordination: Coordination normal.  Psychiatric:        Mood and Affect: Mood normal.        Behavior: Behavior normal.      Imaging: No results found.

## 2022-02-06 NOTE — Progress Notes (Signed)
Kathryn Miller - 57 y.o. female MRN [id number]  Date of birth: [birth year]  Office Visit Note: Visit Date: 01/21/2022 PCP: Pcp, No Referred by: Lorine Bears, NP  Subjective: Chief Complaint  Patient presents with   Lower Back - Pain   HPI:  Kathryn Miller is a 57 y.o. female who comes in todayfor planned radiofrequency ablation of the Right L4-5 and L5-S1 Lumbar facet joints. This would be ablation of the corresponding medial branches and/or dorsal rami.  Patient has had double diagnostic blocks with more than 50% relief.  These are documented on pain diary.  They have had chronic back pain for quite some time, more than 3 months, which has been an ongoing situation with recalcitrant axial back pain.  They have no radicular pain.  Their axial pain is worse with standing and ambulating and on exam today with facet loading.  They have had physical therapy as well as home exercise program.  The imaging noted in the chart below indicated facet pathology. Accordingly they meet all the criteria and qualification for for radiofrequency ablation and we are going to complete this today hopefully for more longer term relief as part of comprehensive management program.   ROS Otherwise per HPI.  Assessment & Plan: Visit Diagnoses:    ICD-10-CM   1. Spondylosis without myelopathy or radiculopathy, lumbar region  M47.816 XR C-ARM NO REPORT    Radiofrequency,Lumbar    methylPREDNISolone acetate (DEPO-MEDROL) injection 80 mg      Plan: No additional findings.   Meds & Orders:  Meds ordered this encounter  Medications   methylPREDNISolone acetate (DEPO-MEDROL) injection 80 mg    Orders Placed This Encounter  Procedures   Radiofrequency,Lumbar   XR C-ARM NO REPORT    Follow-up: Return if symptoms worsen or fail to improve.   Procedures: No procedures performed  Lumbar Facet Joint Nerve Denervation  Patient: Kathryn Miller      Date of Birth: Oct 23, 1965 MRN: [id number] PCP: Pcp,  No      Visit Date: 01/21/2022   Universal Protocol:    Date/Time: 01/20/245:02 PM  Consent Given By: the patient  Position: PRONE  Additional Comments: Vital signs were monitored before and after the procedure. Patient was prepped and draped in the usual sterile fashion. The correct patient, procedure, and site was verified.   Injection Procedure Details:   Procedure diagnoses:  1. Spondylosis without myelopathy or radiculopathy, lumbar region      Meds Administered:  Meds ordered this encounter  Medications   methylPREDNISolone acetate (DEPO-MEDROL) injection 80 mg     Laterality: Right  Location/Site:  L4-L5, L3 and L4 medial branches and L5-S1, L4 medial branch and L5 dorsal ramus  Needle: 18 ga.,  45mm active tip, 17mm RF Cannula  Needle Placement: Along juncture of superior articular process and transverse pocess  Findings:  -Comments:  Procedure Details: For each desired target nerve, the corresponding transverse process (sacral ala for the L5 dorsal rami) was identified and the fluoroscope was positioned to square off the endplates of the corresponding vertebral body to achieve a true AP midline view.  The beam was then obliqued 15 to 20 degrees and caudally tilted 15 to 20 degrees to line up a trajectory along the target nerves. The skin over the target of the junction of superior articulating process and transverse process (sacral ala for the L5 dorsal rami) was infiltrated with 57ml of 1% Lidocaine without Epinephrine.  The 18 gauge 65mm active tip outer  cannula was advanced in trajectory view to the target.  This procedure was repeated for each target nerve.  Then, for all levels, the outer cannula placement was fine-tuned and the position was then confirmed with bi-planar imaging.    Test stimulation was done both at sensory and motor levels to ensure there was no radicular stimulation. The target tissues were then infiltrated with 1 ml of 1% Lidocaine without  Epinephrine. Subsequently, a percutaneous neurotomy was carried out for 90 seconds at 80 degrees Celsius.  After the completion of the lesion, 1 ml of injectate was delivered. It was then repeated for each facet joint nerve mentioned above. Appropriate radiographs were obtained to verify the probe placement during the neurotomy.   Additional Comments:  No complications occurred Dressing: 2 x 2 sterile gauze and Band-Aid    Post-procedure details: Patient was observed during the procedure. Post-procedure instructions were reviewed.  Patient left the clinic in stable condition.      Clinical History: MRI LUMBAR SPINE WITHOUT CONTRAST    TECHNIQUE:  Multiplanar, multisequence MR imaging of the lumbar spine was  performed. No intravenous contrast was administered.    COMPARISON:  Plain films October 19, 2019    FINDINGS:  Segmentation: Standard.    Alignment:  Trace anterolisthesis of L4 over L5.    Vertebrae:  No fracture, evidence of discitis, or bone lesion.    Conus medullaris and cauda equina: Conus extends to the L1-2 level.  Conus and cauda equina appear normal.    Paraspinal and other soft tissues: Negative.    Disc levels:    T12-L1: No spinal canal or neural foraminal stenosis.    L1-2: No spinal canal or neural foraminal stenosis.    L2-3: No spinal canal or neural foraminal stenosis.    L3-4: Shallow disc bulge and mild facet degenerative changes without  significant spinal canal or neural foraminal stenosis.    L4-5: Disc bulge with superimposed tiny left far lateral disc  protrusion, advanced facet degenerative changes and ligamentum  flavum redundancy without significant spinal canal or neural  foraminal stenosis.    L5-S1: Small left foraminal/far lateral disc protrusion, advanced  right and moderate left facet degenerative changes. Findings result  in mild left neural foraminal narrowing. No spinal canal stenosis.    IMPRESSION:  1. Advanced  degenerative changes of the facet joints at L4-L5 and  L5-S1, more pronounced on the right at L5-S1.  2. Mild left neural foraminal narrowing at L5-S1.  3. No significant spinal canal stenosis or evidence of nerve  impingement.      Electronically Signed    By: Baldemar Lenis M.D.    On: 02/13/2020 16:55     Objective:  VS:  HT:    WT:   BMI:     BP:   HR: bpm  TEMP: ( )  RESP:  Physical Exam Vitals and nursing note reviewed.  Constitutional:      General: She is not in acute distress.    Appearance: Normal appearance. She is not ill-appearing.  HENT:     Head: Normocephalic and atraumatic.     Right Ear: External ear normal.     Left Ear: External ear normal.  Eyes:     Extraocular Movements: Extraocular movements intact.  Cardiovascular:     Rate and Rhythm: Normal rate.     Pulses: Normal pulses.  Pulmonary:     Effort: Pulmonary effort is normal. No respiratory distress.  Abdominal:  General: There is no distension.     Palpations: Abdomen is soft.  Musculoskeletal:        General: Tenderness present.     Cervical back: Neck supple.     Right lower leg: No edema.     Left lower leg: No edema.     Comments: Patient has good distal strength with no pain over the greater trochanters.  No clonus or focal weakness.  Skin:    Findings: No erythema, lesion or rash.  Neurological:     General: No focal deficit present.     Mental Status: She is alert and oriented to person, place, and time.     Sensory: No sensory deficit.     Motor: No weakness or abnormal muscle tone.     Coordination: Coordination normal.  Psychiatric:        Mood and Affect: Mood normal.        Behavior: Behavior normal.      Imaging: No results found.

## 2022-02-06 NOTE — Procedures (Signed)
Lumbar Facet Joint Nerve Denervation  Patient: Kathryn Miller      Date of Birth: December 04, 1965 MRN: [id number] PCP: Pcp, No      Visit Date: 02/04/2022   Universal Protocol:    Date/Time: 01/20/245:09 PM  Consent Given By: the patient  Position: PRONE  Additional Comments: Vital signs were monitored before and after the procedure. Patient was prepped and draped in the usual sterile fashion. The correct patient, procedure, and site was verified.   Injection Procedure Details:   Procedure diagnoses:  1. Spondylosis without myelopathy or radiculopathy, lumbar region      Meds Administered:  Meds ordered this encounter  Medications   methylPREDNISolone acetate (DEPO-MEDROL) injection 80 mg     Laterality: Left  Location/Site:  L4-L5, L3 and L4 medial branches and L5-S1, L4 medial branch and L5 dorsal ramus  Needle: 18 ga.,  73mm active tip, 152mm RF Cannula  Needle Placement: Along juncture of superior articular process and transverse pocess  Findings:  -Comments:  Procedure Details: For each desired target nerve, the corresponding transverse process (sacral ala for the L5 dorsal rami) was identified and the fluoroscope was positioned to square off the endplates of the corresponding vertebral body to achieve a true AP midline view.  The beam was then obliqued 15 to 20 degrees and caudally tilted 15 to 20 degrees to line up a trajectory along the target nerves. The skin over the target of the junction of superior articulating process and transverse process (sacral ala for the L5 dorsal rami) was infiltrated with 37ml of 1% Lidocaine without Epinephrine.  The 18 gauge 51mm active tip outer cannula was advanced in trajectory view to the target.  This procedure was repeated for each target nerve.  Then, for all levels, the outer cannula placement was fine-tuned and the position was then confirmed with bi-planar imaging.    Test stimulation was done both at sensory and motor  levels to ensure there was no radicular stimulation. The target tissues were then infiltrated with 1 ml of 1% Lidocaine without Epinephrine. Subsequently, a percutaneous neurotomy was carried out for 90 seconds at 80 degrees Celsius.  After the completion of the lesion, 1 ml of injectate was delivered. It was then repeated for each facet joint nerve mentioned above. Appropriate radiographs were obtained to verify the probe placement during the neurotomy.   Additional Comments:  No complications occurred Dressing: 2 x 2 sterile gauze and Band-Aid    Post-procedure details: Patient was observed during the procedure. Post-procedure instructions were reviewed.  Patient left the clinic in stable condition.

## 2022-03-18 NOTE — Telephone Encounter (Signed)
Error

## 2022-03-21 ENCOUNTER — Ambulatory Visit (HOSPITAL_COMMUNITY)
Admission: EM | Admit: 2022-03-21 | Discharge: 2022-03-21 | Disposition: A | Discharge status: Home / Self Care | Payer: 59 | Attending: Emergency Medicine | Admitting: Emergency Medicine

## 2022-03-21 ENCOUNTER — Encounter (HOSPITAL_COMMUNITY): Payer: Self-pay

## 2022-03-21 DIAGNOSIS — K0889 Other specified disorders of teeth and supporting structures: Secondary | ICD-10-CM | POA: Diagnosis not present

## 2022-03-21 MED ORDER — TRAMADOL HCL 50 MG PO TABS: 50.0000 mg | ORAL_TABLET | Freq: Four times a day (QID) | ORAL | 0 refills | Status: AC | PRN

## 2022-03-21 MED ORDER — LIDOCAINE VISCOUS HCL 2 % MT SOLN: 5.0000 mL | Freq: Four times a day (QID) | OROMUCOSAL | 0 refills | Status: AC | PRN

## 2022-03-21 MED ORDER — AMOXICILLIN-POT CLAVULANATE 875-125 MG PO TABS: 1.0000 | ORAL_TABLET | Freq: Two times a day (BID) | ORAL | 0 refills | Status: AC

## 2022-03-21 NOTE — Discharge Instructions (Addendum)
Being treated.   Begin use of Augmentin every morning and every evening to provide bacterial coverage, will take approximately 48 hours for you to see improvement and should be steady progression from there  You may gargle and spit Magic mouthwash solution every 4-6 hours to provide temporary relief to your discomfort You may use tramadol every 6 hours as needed for severe pain, be mindful this may potentially make you drowsy, may continue use of Tylenol and Motrin in addition to this  May attempt use of salt water gargles, throat lozenges warm liquids and soft foods for additional comfort  Please continue to reach out to dentist, you have given information to one of the local offices for further evaluation and management

## 2022-03-21 NOTE — ED Triage Notes (Signed)
Patient here today for left side toothache X 2 days. She is also having left ear pain. She has used Motrin, Tylenol, and an oral gel numbing medication but nothing is helping.

## 2022-03-21 NOTE — ED Provider Notes (Signed)
[place]    CSN: [id number] Arrival date & time: 03/21/22  1106      History   Chief Complaint Chief Complaint  Patient presents with   Dental Pain    HPI Kathryn Miller is a 57 y.o. female.   Patient presents for evaluation of left lower gumline dental pain present for 2 days.  Worsening severity.  Is in need of dental work, believes she needs tooth extractions.  Has attempted use of Tylenol, Motrin and a topical numbing cream which have been minimally effective.  Denies fever or drainage.   Past Medical History:  Diagnosis Date   Anemia    Chest pain    Murmur, cardiac     Patient Active Problem List   Diagnosis Date Noted   Chronic right-sided low back pain without sciatica 09/17/2019   Morbid obesity ([place]) 04/08/2016   Screen for colon cancer 04/08/2016   HYPERTHYROIDISM 07/16/2009   CHALAZION [id number]   SYSTOLIC MURMUR [id number]   METRORRHAGIA 10/07/2008   BACTERIAL VAGINITIS 02/28/2008   HOT FLASHES 02/28/2008   Pain in limb 08/30/2007   DENTAL CARIES 08/08/2007   ALLERGIC RHINITIS 06/02/2007   OTITIS MEDIA, LEFT 03/09/2007   CARBUNCLE, ARM 03/09/2007   PLANTAR FASCIITIS 05/21/2006   ANEMIA-IRON DEFICIENCY 11/16/2005    Past Surgical History:  Procedure Laterality Date   CESAREAN SECTION  [phone]   TUBAL LIGATION  2004    OB History   No obstetric history on file.      Home Medications    Prior to Admission medications   Medication Sig Start Date End Date Taking? Authorizing Provider  chlorhexidine (PERIDEX) 0.12 % solution Use as directed 15 mLs in the mouth or throat 2 (two) times daily. 07/08/21   Lynden Oxford Scales, PA-C  diazepam (VALIUM) 5 MG tablet Take one tablet by mouth with food one hour prior to procedure. May repeat 30 minutes prior if needed. 01/14/22   Lorine Bears, NP  ibuprofen (ADVIL) 800 MG tablet TAKE 1 TABLET BY MOUTH EVERY 8 HOURS AS NEEDED 09/04/21   Magnant, Gerrianne Scale, PA-C    Family  History Family History  Problem Relation Age of Onset   Breast cancer Mother    Diabetes Mother    Hypertension Mother     Social History Social History   Tobacco Use   Smoking status: Never   Smokeless tobacco: Never  Vaping Use   Vaping Use: Never used  Substance Use Topics   Alcohol use: No   Drug use: No     Allergies   Codeine   Review of Systems Review of Systems Defer to HPI    Physical Exam Triage Vital Signs ED Triage Vitals  Enc Vitals Group     BP 03/21/22 1157 134/80     Pulse Rate 03/21/22 1157 63     Resp 03/21/22 1157 16     Temp 03/21/22 1157 99 F (37.2 C)     Temp Source 03/21/22 1157 Oral     SpO2 03/21/22 1157 95 %     Weight 03/21/22 1157 213 lb (96.6 kg)     Height 03/21/22 1157 '5\' 1"'$  (1.549 m)     Head Circumference --      Peak Flow --      Pain Score 03/21/22 1156 10     Pain Loc --      Pain Edu? --      Excl. in [place]? --    No data  found.  Updated Vital Signs BP 134/80 (BP Location: Right Arm)   Pulse 63   Temp 99 F (37.2 C) (Oral)   Resp 16   Ht '5\' 1"'$  (1.549 m)   Wt 213 lb (96.6 kg)   SpO2 95%   BMI 40.25 kg/m   Visual Acuity Right Eye Distance:   Left Eye Distance:   Bilateral Distance:    Right Eye Near:   Left Eye Near:    Bilateral Near:     Physical Exam Constitutional:      Appearance: Normal appearance.  HENT:     Head: Normocephalic.     Mouth/Throat:     Comments: Dental caries and decay present throughout the upper and lower teeth, mild to moderate gingival swelling along the left lower gumline, no abscess noted, pharynx is clear without obstruction Eyes:     Extraocular Movements: Extraocular movements intact.  Pulmonary:     Effort: Pulmonary effort is normal.  Neurological:     Mental Status: She is alert and oriented to person, place, and time. Mental status is at baseline.      UC Treatments / Results  Labs (all labs ordered are listed, but only abnormal results are displayed) Labs  Reviewed - No data to display  EKG   Radiology No results found.  Procedures Procedures (including critical care time)  Medications Ordered in UC Medications - No data to display  Initial Impression / Assessment and Plan / UC Course  I have reviewed the triage vital signs and the nursing notes.  Pertinent labs & imaging results that were available during my care of the patient were reviewed by me and considered in my medical decision making (see chart for details).  Dental pain  Prophylactically providing antibiotic, prescribed Magic mouthwash and tramadol for pain, PDMP reviewed, low risk, given information to a local dentist, may follow-up with his urgent care as needed Final Clinical Impressions(s) / UC Diagnoses   Final diagnoses:  None   Discharge Instructions   None    ED Prescriptions   None    PDMP not reviewed this encounter.   Hans Eden, NP 03/21/22 1216

## 2022-08-05 IMAGING — DX DG KNEE COMPLETE 4+V*R*
4 series · 4 of 4 positions shown · non-contrast
Comparison: None.

CLINICAL DATA: Medial pain and swelling, chronic pain, no reported
injury.

EXAM:
RIGHT KNEE - COMPLETE 4+ VIEW

[knee ap]
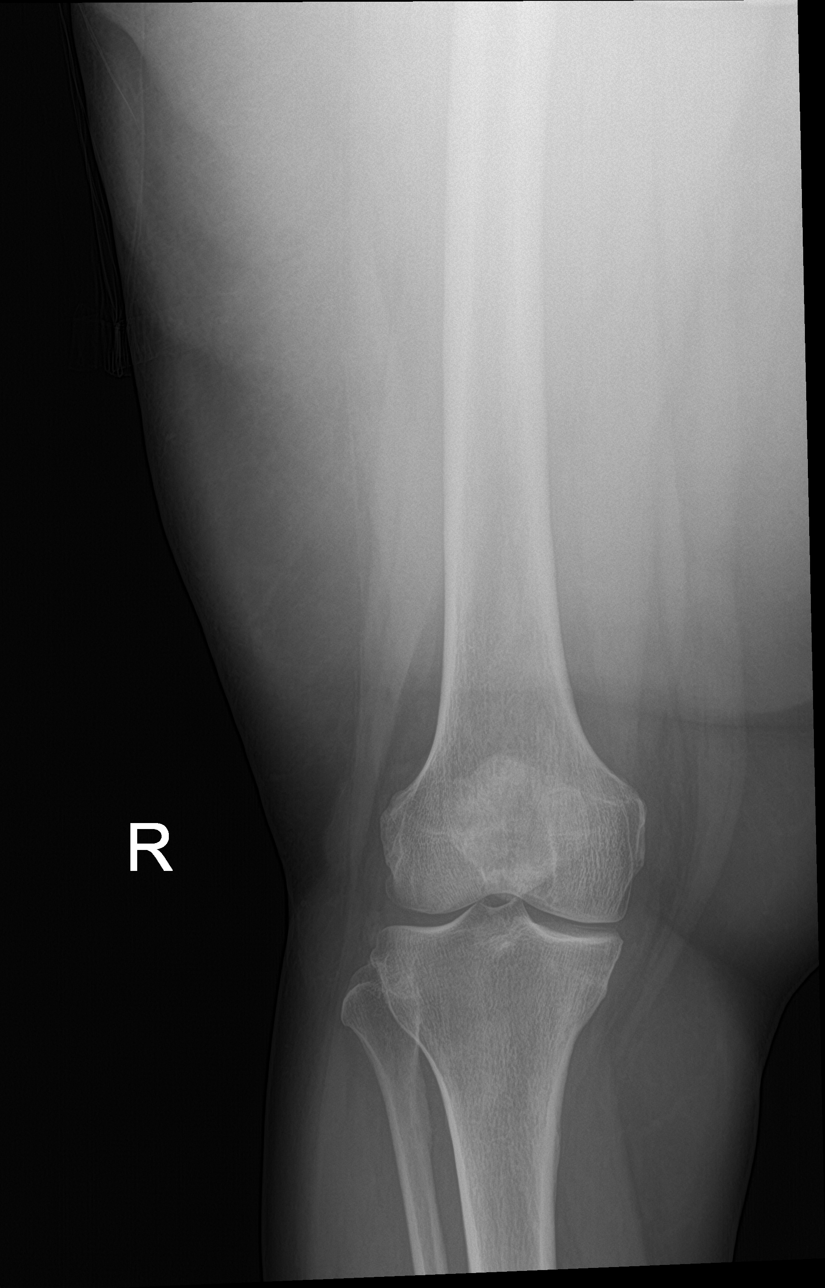

[knee obl (1 of 2)]
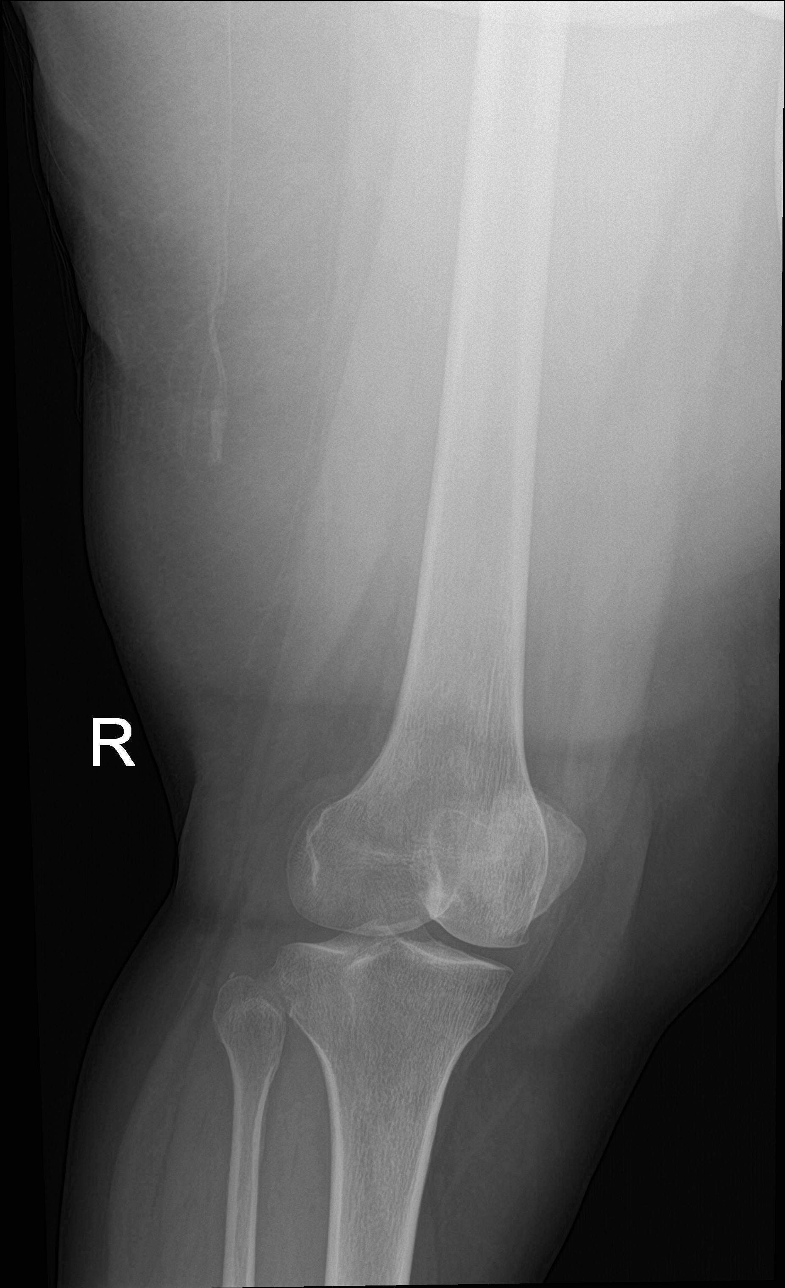

[knee obl (2 of 2)]
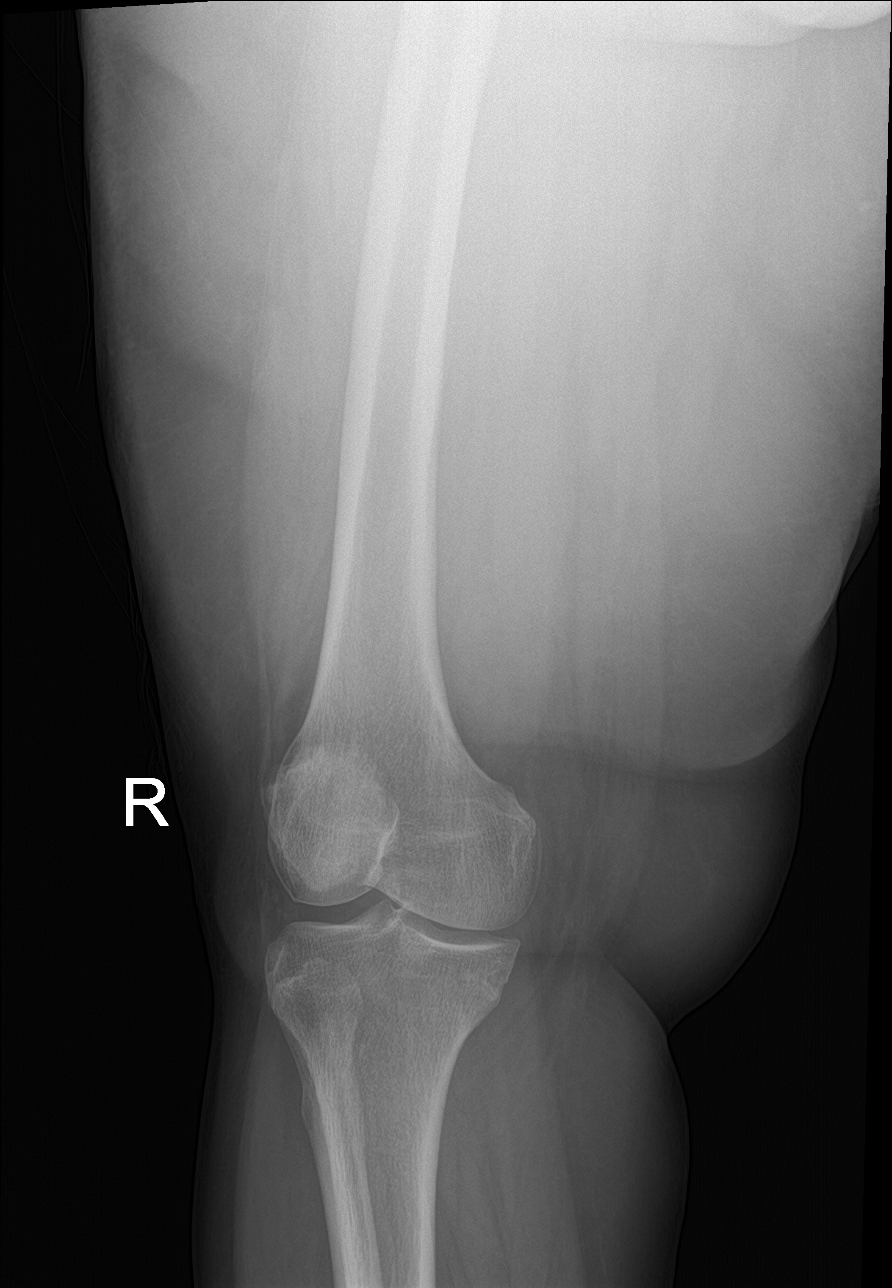

[knee lat]
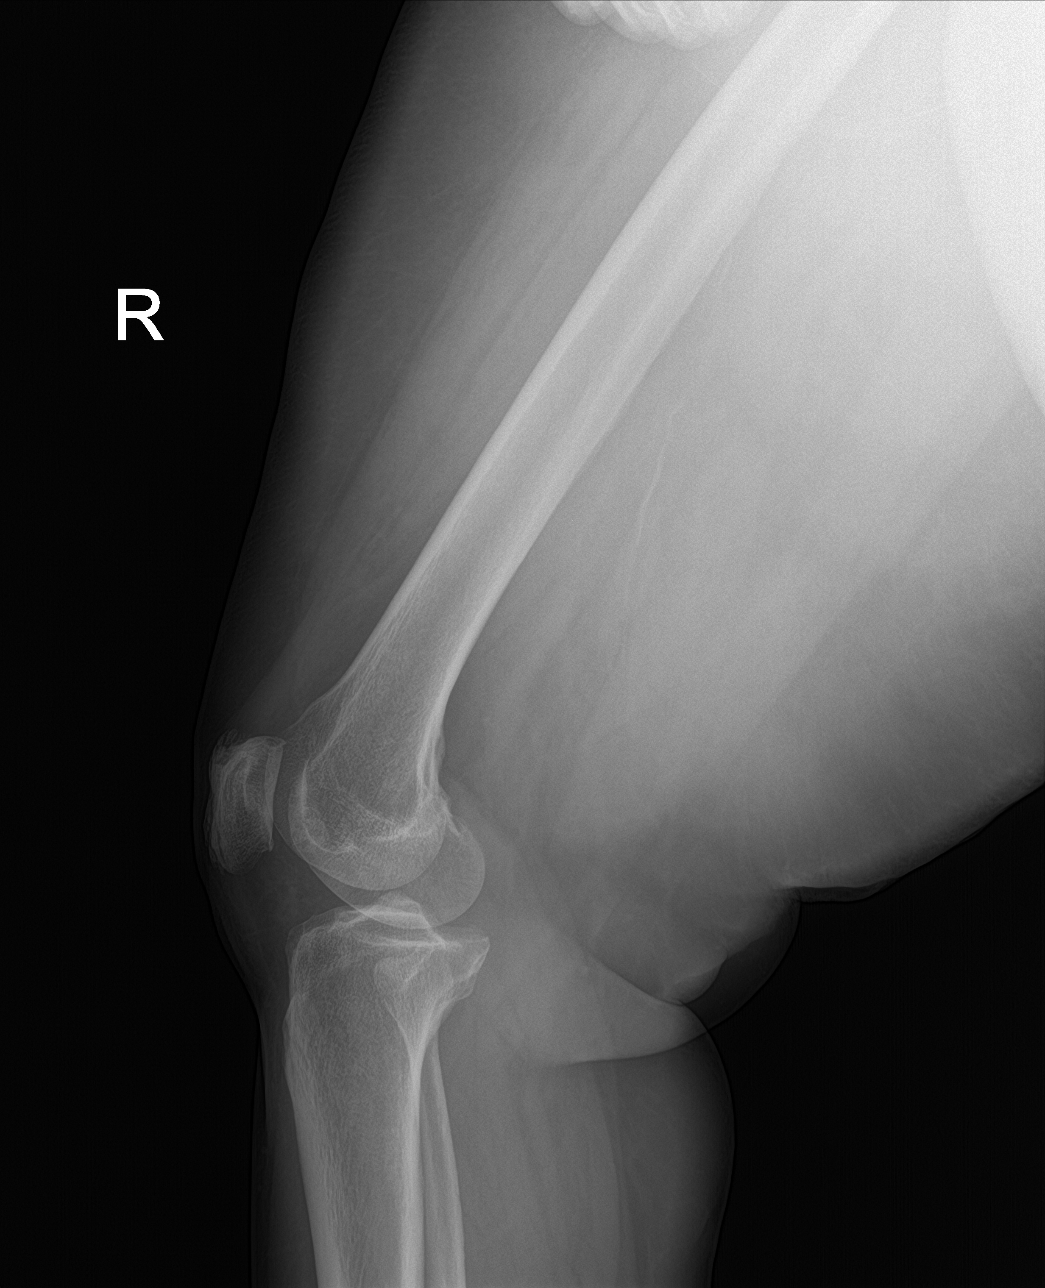

[4 of 4 positions shown; findings below may reference images not displayed]

FINDINGS: No acute osseous or joint abnormality. There may be slight varus
angulation at the knee. Trace patellofemoral osteophytosis.
IMPRESSION: 1. No acute findings.
2. Trace patellofemoral osteophytosis. Suspect mild varus angulation
of the knee.

## 2022-11-11 ENCOUNTER — Telehealth: Payer: Self-pay | Admitting: Physical Medicine and Rehabilitation

## 2022-11-11 NOTE — Telephone Encounter (Signed)
Patient called needing to schedule an appointment with Dr Alvester Morin for rt side/rt buttocks pain. The  number to contact patient is [phone]

## 2022-11-13 ENCOUNTER — Encounter: Payer: Self-pay | Admitting: Physical Medicine and Rehabilitation

## 2022-11-15 NOTE — Telephone Encounter (Signed)
MyChart message sent to patient.

## 2022-11-30 ENCOUNTER — Telehealth: Payer: Self-pay | Admitting: Physical Medicine and Rehabilitation

## 2022-11-30 NOTE — Telephone Encounter (Signed)
Patient called and said the pain transfers down to the top of her butt, then down to her knee. [phone]

## 2022-12-02 ENCOUNTER — Ambulatory Visit: Payer: 59 | Admitting: Physical Medicine and Rehabilitation

## 2022-12-03 ENCOUNTER — Ambulatory Visit: Payer: 59 | Admitting: Physical Medicine and Rehabilitation

## 2022-12-03 ENCOUNTER — Encounter: Payer: Self-pay | Admitting: Physical Medicine and Rehabilitation

## 2022-12-03 VITALS — BP 137/84 | HR 73

## 2022-12-03 DIAGNOSIS — M545 Low back pain, unspecified: Secondary | ICD-10-CM

## 2022-12-03 DIAGNOSIS — G8929 Other chronic pain: Secondary | ICD-10-CM | POA: Diagnosis not present

## 2022-12-03 DIAGNOSIS — M7918 Myalgia, other site: Secondary | ICD-10-CM

## 2022-12-03 DIAGNOSIS — M47816 Spondylosis without myelopathy or radiculopathy, lumbar region: Secondary | ICD-10-CM | POA: Diagnosis not present

## 2022-12-03 MED ORDER — DIAZEPAM 5 MG PO TABS: ORAL_TABLET | ORAL | 0 refills | Status: DC

## 2022-12-03 NOTE — Progress Notes (Unsigned)
Kathryn Miller - 57 y.o. female MRN [id number]  Date of birth: 25-Jul-1965  Office Visit Note: Visit Date: 12/03/2022 PCP: Pcp, No Referred by: No ref. provider found  Subjective: Chief Complaint  Patient presents with   Lower Back - Pain   Right Leg - Pain   HPI: Kathryn Miller is a 57 y.o. female who comes in today for evaluation of chronic, worsening and severe bilateral lower back pain, intermittent referral to left anterior thigh. Pain ongoing for several years, increased over the last several weeks. Her pain worsens when moving from sitting to standing position and when sitting to travel, she describes as sore and aching, currently rates as 10 out of 10. Some relief of pain with formal physical therapy, home exercise regimen, rest and use of medications. Some relief of pain with Zanaflex. Lumbar MRI imaging from 2022 exhibits advanced degenerative changes of the facet joints at L4-L5 and L5-S1, more pronounced on the right at L5-S1. Trace anterolisthesis of L4 on L5. No significant spinal canal stenosis or evidence of nerve impingement. Patient underwent bilateral L4-L5 and L5-S1 radiofrequency ablation in our office on 02/04/2022. She reports greater than 80% relief of pain for 9 months with this procedure. Patient is active, currently working full time in ER as CNA. She does travel frequently to visit her mother in [place]. Patient denies focal weakness, numbness and tingling. No recent trauma or falls.      Review of Systems  Musculoskeletal:  Positive for back pain.  Neurological:  Negative for tingling, sensory change, focal weakness and weakness.  All other systems reviewed and are negative.  Otherwise per HPI.  Assessment & Plan: Visit Diagnoses:    ICD-10-CM   1. Chronic bilateral low back pain without sciatica  M54.50 Ambulatory referral to Physical Medicine Rehab   G89.29     2. Spondylosis without myelopathy or radiculopathy, lumbar region  M47.816 Ambulatory  referral to Physical Medicine Rehab    3. Facet hypertrophy of lumbar region  M47.816 Ambulatory referral to Physical Medicine Rehab    4. Myofascial pain syndrome  M79.18 Ambulatory referral to Physical Medicine Rehab       Plan: Findings:  Chronic, worsening and severe bilateral lower back pain. Intermittent referral of pain to left anterior thigh. Patient continues to have severe pain despite good conservative therapies such as formal physical therapy, home exercise regimen, rest and use of medications. Patients clinical presentation and exam are consistent with facet mediated pain. I do feel intermittent pain radiating down left anterior thigh is more of a facet joint syndrome. There are advanced degenerative changes of the facet joints at L4-L5 and L5-S1. I also feel there could be a myofascial component contributing to her pain. Tenderness noted to bilateral lumbar paraspinal regions upon exam today. We discussed treatment plan in detail today. Next step is to perform bilateral L4-L5 and L5-S1 radiofrequency ablation under fluoroscopic guidance. She did voice anxiety related to injections, I did prescribe pre-procedure Valium. Patient has no questions at this time. I encouraged patient to continue with Zanaflex as needed and to remain active. No red flag symptoms noted upon exam today.     Meds & Orders:  Meds ordered this encounter  Medications   diazepam (VALIUM) 5 MG tablet    Sig: Take one tablet by mouth with food one hour prior to procedure. May repeat 30 minutes prior if needed.    Dispense:  2 tablet    Refill:  0  Orders Placed This Encounter  Procedures   Ambulatory referral to Physical Medicine Rehab    Follow-up: Return for Bilateral L4-L5 and L5-S1 radiofrequency ablation.   Procedures: No procedures performed      Clinical History: MRI LUMBAR SPINE WITHOUT CONTRAST    TECHNIQUE:  Multiplanar, multisequence MR imaging of the lumbar spine was  performed. No  intravenous contrast was administered.    COMPARISON:  Plain films October 19, 2019    FINDINGS:  Segmentation: Standard.    Alignment:  Trace anterolisthesis of L4 over L5.    Vertebrae:  No fracture, evidence of discitis, or bone lesion.    Conus medullaris and cauda equina: Conus extends to the L1-2 level.  Conus and cauda equina appear normal.    Paraspinal and other soft tissues: Negative.    Disc levels:    T12-L1: No spinal canal or neural foraminal stenosis.    L1-2: No spinal canal or neural foraminal stenosis.    L2-3: No spinal canal or neural foraminal stenosis.    L3-4: Shallow disc bulge and mild facet degenerative changes without  significant spinal canal or neural foraminal stenosis.    L4-5: Disc bulge with superimposed tiny left far lateral disc  protrusion, advanced facet degenerative changes and ligamentum  flavum redundancy without significant spinal canal or neural  foraminal stenosis.    L5-S1: Small left foraminal/far lateral disc protrusion, advanced  right and moderate left facet degenerative changes. Findings result  in mild left neural foraminal narrowing. No spinal canal stenosis.    IMPRESSION:  1. Advanced degenerative changes of the facet joints at L4-L5 and  L5-S1, more pronounced on the right at L5-S1.  2. Mild left neural foraminal narrowing at L5-S1.  3. No significant spinal canal stenosis or evidence of nerve  impingement.      Electronically Signed    By: Baldemar Lenis M.D.    On: 02/13/2020 16:55   She reports that she has never smoked. She has never used smokeless tobacco. No results for input(s): "HGBA1C", "LABURIC" in the last 8760 hours.  Objective:  VS:  HT:    WT:   BMI:     BP:137/84  HR:73bpm  TEMP: ( )  RESP:  Physical Exam Vitals and nursing note reviewed.  HENT:     Head: Normocephalic and atraumatic.     Right Ear: External ear normal.     Left Ear: External ear normal.     Nose: Nose  normal.     Mouth/Throat:     Mouth: Mucous membranes are moist.  Eyes:     Extraocular Movements: Extraocular movements intact.  Cardiovascular:     Rate and Rhythm: Normal rate.     Pulses: Normal pulses.  Pulmonary:     Effort: Pulmonary effort is normal.  Abdominal:     General: Abdomen is flat. There is no distension.  Musculoskeletal:        General: Tenderness present.     Cervical back: Normal range of motion.     Comments: Patient is slow to rise from seated position to standing. Pain noted with facet loading and lumbar extension. 5/5 strength noted with bilateral hip flexion, knee flexion/extension, ankle dorsiflexion/plantarflexion and EHL. No clonus noted bilaterally. No pain upon palpation of greater trochanters. No pain with internal/external rotation of bilateral hips. Sensation intact bilaterally. Negative slump test bilaterally. Ambulates without aid, gait steady.     Skin:    General: Skin is warm and dry.  Capillary Refill: Capillary refill takes less than 2 seconds.  Neurological:     General: No focal deficit present.     Mental Status: She is alert and oriented to person, place, and time.  Psychiatric:        Mood and Affect: Mood normal.        Behavior: Behavior normal.     Ortho Exam  Imaging: No results found.  Past Medical/Family/Surgical/Social History: Medications & Allergies reviewed per EMR, new medications updated. Patient Active Problem List   Diagnosis Date Noted   Chronic right-sided low back pain without sciatica 09/17/2019   Morbid obesity ([facility]) 04/08/2016   Screen for colon cancer 04/08/2016   Thyrotoxicosis 07/16/2009   CHALAZION 07/15/2009   SYSTOLIC MURMUR 07/15/2009   METRORRHAGIA 10/07/2008   Vaginitis and vulvovaginitis 02/28/2008   HOT FLASHES 02/28/2008   Pain in limb 08/30/2007   Dental caries 08/08/2007   Allergic rhinitis 06/02/2007   Otitis media 03/09/2007   CARBUNCLE, ARM 03/09/2007   PLANTAR FASCIITIS  05/21/2006   ANEMIA-IRON DEFICIENCY 11/16/2005   Past Medical History:  Diagnosis Date   Anemia    Chest pain    Murmur, cardiac    Family History  Problem Relation Age of Onset   Breast cancer Mother    Diabetes Mother    Hypertension Mother    Past Surgical History:  Procedure Laterality Date   CESAREAN SECTION  [phone]   TUBAL LIGATION  2004   Social History   Occupational History   Occupation: unemployed  Tobacco Use   Smoking status: Never   Smokeless tobacco: Never  Vaping Use   Vaping status: Never Used  Substance and Sexual Activity   Alcohol use: No   Drug use: No   Sexual activity: Not on file

## 2022-12-03 NOTE — Progress Notes (Unsigned)
Functional Pain Scale - descriptive words and definitions   Severe (9)  Cannot do any ADL's even with assistance can barely talk/unable to sleep and unable to use distraction. Severe range order  Average Pain 10  Patient has been have severe right sided LBP, the burning pain radiates to right and left legs.

## 2023-02-08 ENCOUNTER — Other Ambulatory Visit: Payer: Self-pay | Admitting: Internal Medicine

## 2023-02-08 DIAGNOSIS — Z0000 Encounter for general adult medical examination without abnormal findings: Secondary | ICD-10-CM

## 2023-02-24 ENCOUNTER — Ambulatory Visit
Admission: RE | Admit: 2023-02-24 | Discharge: 2023-02-24 | Disposition: A | Discharge status: Home / Self Care | Payer: 59 | Source: Ambulatory Visit | Attending: Internal Medicine | Admitting: Internal Medicine

## 2023-02-24 DIAGNOSIS — Z0000 Encounter for general adult medical examination without abnormal findings: Secondary | ICD-10-CM

## 2023-02-24 DIAGNOSIS — Z1231 Encounter for screening mammogram for malignant neoplasm of breast: Secondary | ICD-10-CM | POA: Diagnosis not present

## 2023-03-18 ENCOUNTER — Telehealth: Payer: Self-pay | Admitting: Physical Medicine and Rehabilitation

## 2023-03-18 ENCOUNTER — Other Ambulatory Visit: Payer: Self-pay | Admitting: Physical Medicine and Rehabilitation

## 2023-03-18 MED ORDER — DIAZEPAM 5 MG PO TABS: ORAL_TABLET | ORAL | 0 refills | Status: DC

## 2023-03-18 NOTE — Telephone Encounter (Signed)
 Pt called requesting medication to keep her calm for appt with Dr Alvester Morin on 3/3. Please send medication to pharmacy on file. Pt phone number is [phone].

## 2023-03-21 ENCOUNTER — Other Ambulatory Visit: Payer: Self-pay

## 2023-03-21 ENCOUNTER — Ambulatory Visit: Payer: 59 | Admitting: Physical Medicine and Rehabilitation

## 2023-03-21 VITALS — BP 144/88 | HR 65

## 2023-03-21 DIAGNOSIS — M47816 Spondylosis without myelopathy or radiculopathy, lumbar region: Secondary | ICD-10-CM

## 2023-03-21 MED ORDER — METHYLPREDNISOLONE ACETATE 40 MG/ML IJ SUSP
40.0000 mg | Freq: Once | INTRAMUSCULAR | Status: AC
  Administered 2023-03-21: 40 mg

## 2023-03-21 MED ORDER — DIAZEPAM 5 MG PO TABS: ORAL_TABLET | ORAL | 0 refills | Status: AC

## 2023-03-21 NOTE — Progress Notes (Signed)
 Pain Score--8 No Blood Thinners No allergies to Contrast Dye

## 2023-03-21 NOTE — Patient Instructions (Signed)

## 2023-03-21 NOTE — Progress Notes (Signed)
 Kathryn Miller - 58 y.o. female MRN [id number]  Date of birth: Jun 09, 1965  Office Visit Note: Visit Date: 03/21/2023 PCP: Pcp, No Referred by: Juanda Chance, NP  Subjective: Chief Complaint  Patient presents with   Lower Back - Pain   HPI:  Kathryn Miller is a 58 y.o. female who comes in todayfor planned repeat radiofrequency ablation of the Left L4-5 and L5-S1  Lumbar facet joints. This would be ablation of the corresponding medial branches and/or dorsal rami.  Patient has had double diagnostic blocks with more than 70% relief.  Subsequent ablation gave them more than 6 months of over 60% relief.  They have had chronic back pain for quite some time, more than 3 months, which has been an ongoing situation with recalcitrant axial back pain.  They have no radicular pain.  Their axial pain is worse with standing and ambulating and on exam today with facet loading.  They have had physical therapy as well as home exercise program.  The imaging noted in the chart below indicated facet pathology. Accordingly they meet all the criteria and qualification for for radiofrequency ablation and we are going to complete this today hopefully for more longer term relief as part of comprehensive management program.   ROS Otherwise per HPI.  Assessment & Plan: Visit Diagnoses:    ICD-10-CM   1. Spondylosis without myelopathy or radiculopathy, lumbar region  M47.816 XR C-ARM NO REPORT    Radiofrequency,Lumbar    methylPREDNISolone acetate (DEPO-MEDROL) injection 40 mg      Plan: No additional findings.   Meds & Orders:  Meds ordered this encounter  Medications   methylPREDNISolone acetate (DEPO-MEDROL) injection 40 mg   diazepam (VALIUM) 5 MG tablet    Sig: Take one tablet by mouth with light food one hour prior to procedure.    Dispense:  1 tablet    Refill:  0    Orders Placed This Encounter  Procedures   Radiofrequency,Lumbar   XR C-ARM NO REPORT    Follow-up: Return if symptoms  worsen or fail to improve.   Procedures: No procedures performed  Lumbar Facet Joint Nerve Denervation  Patient: Kathryn Miller      Date of Birth: [birth year] MRN: [id number] PCP: Pcp, No      Visit Date: 03/21/2023   Universal Protocol:    Date/Time: 03/03/254:11 PM  Consent Given By: the patient  Position: PRONE  Additional Comments: Vital signs were monitored before and after the procedure. Patient was prepped and draped in the usual sterile fashion. The correct patient, procedure, and site was verified.   Injection Procedure Details:   Procedure diagnoses:  1. Spondylosis without myelopathy or radiculopathy, lumbar region      Meds Administered:  Meds ordered this encounter  Medications   methylPREDNISolone acetate (DEPO-MEDROL) injection 40 mg   diazepam (VALIUM) 5 MG tablet    Sig: Take one tablet by mouth with light food one hour prior to procedure.    Dispense:  1 tablet    Refill:  0     Laterality: Left  Location/Site:  L4-L5, L3 and L4 medial branches and L5-S1, L4 medial branch and L5 dorsal ramus  Needle: 18 ga.,  10mm active tip, RF Cannula  Needle Placement: Along juncture of superior articular process and transverse pocess  Findings:  -Comments:  Procedure Details: For each desired target nerve, the corresponding transverse process (sacral ala for the L5 dorsal rami) was identified and the fluoroscope was positioned  to square off the endplates of the corresponding vertebral body to achieve a true AP midline view.  The beam was then obliqued 15 to 20 degrees and caudally tilted 15 to 20 degrees to line up a trajectory along the target nerves. The skin over the target of the junction of superior articulating process and transverse process (sacral ala for the L5 dorsal rami) was infiltrated with 1ml of 1% Lidocaine without Epinephrine.  The 18 gauge 10mm active tip outer cannula was advanced in trajectory view to the target.  This procedure was  repeated for each target nerve.  Then, for all levels, the outer cannula placement was fine-tuned and the position was then confirmed with bi-planar imaging.    Test stimulation was done both at sensory and motor levels to ensure there was no radicular stimulation. The target tissues were then infiltrated with 1 ml of 1% Lidocaine without Epinephrine. Subsequently, a percutaneous neurotomy was carried out for 90 seconds at 80 degrees Celsius.  After the completion of the lesion, 1 ml of injectate was delivered. It was then repeated for each facet joint nerve mentioned above. Appropriate radiographs were obtained to verify the probe placement during the neurotomy.   Additional Comments:  No complications occurred Dressing: 2 x 2 sterile gauze and Band-Aid    Post-procedure details: Patient was observed during the procedure. Post-procedure instructions were reviewed.  Patient left the clinic in stable condition.      Clinical History: MRI LUMBAR SPINE WITHOUT CONTRAST    TECHNIQUE:  Multiplanar, multisequence MR imaging of the lumbar spine was  performed. No intravenous contrast was administered.    COMPARISON:  Plain films October 19, 2019    FINDINGS:  Segmentation: Standard.    Alignment:  Trace anterolisthesis of L4 over L5.    Vertebrae:  No fracture, evidence of discitis, or bone lesion.    Conus medullaris and cauda equina: Conus extends to the L1-2 level.  Conus and cauda equina appear normal.    Paraspinal and other soft tissues: Negative.    Disc levels:    T12-L1: No spinal canal or neural foraminal stenosis.    L1-2: No spinal canal or neural foraminal stenosis.    L2-3: No spinal canal or neural foraminal stenosis.    L3-4: Shallow disc bulge and mild facet degenerative changes without  significant spinal canal or neural foraminal stenosis.    L4-5: Disc bulge with superimposed tiny left far lateral disc  protrusion, advanced facet degenerative changes  and ligamentum  flavum redundancy without significant spinal canal or neural  foraminal stenosis.    L5-S1: Small left foraminal/far lateral disc protrusion, advanced  right and moderate left facet degenerative changes. Findings result  in mild left neural foraminal narrowing. No spinal canal stenosis.    IMPRESSION:  1. Advanced degenerative changes of the facet joints at L4-L5 and  L5-S1, more pronounced on the right at L5-S1.  2. Mild left neural foraminal narrowing at L5-S1.  3. No significant spinal canal stenosis or evidence of nerve  impingement.      Electronically Signed    By: Baldemar Lenis M.D.    On: 02/13/2020 16:55     Objective:  VS:  HT:    WT:   BMI:     BP:(!) 144/88  HR:65bpm  TEMP: ( )  RESP:  Physical Exam Vitals and nursing note reviewed.  Constitutional:      General: She is not in acute distress.    Appearance: Normal  appearance. She is obese. She is not ill-appearing.  HENT:     Head: Normocephalic and atraumatic.     Right Ear: External ear normal.     Left Ear: External ear normal.  Eyes:     Extraocular Movements: Extraocular movements intact.  Cardiovascular:     Rate and Rhythm: Normal rate.     Pulses: Normal pulses.  Pulmonary:     Effort: Pulmonary effort is normal. No respiratory distress.  Abdominal:     General: There is no distension.     Palpations: Abdomen is soft.  Musculoskeletal:        General: Tenderness present.     Cervical back: Neck supple.     Right lower leg: No edema.     Left lower leg: No edema.     Comments: Patient has good distal strength with no pain over the greater trochanters.  No clonus or focal weakness.  Skin:    Findings: No erythema, lesion or rash.  Neurological:     General: No focal deficit present.     Mental Status: She is alert and oriented to person, place, and time.     Sensory: No sensory deficit.     Motor: No weakness or abnormal muscle tone.     Coordination:  Coordination normal.  Psychiatric:        Mood and Affect: Mood normal.        Behavior: Behavior normal.      Imaging: No results found.

## 2023-03-21 NOTE — Procedures (Signed)
 Lumbar Facet Joint Nerve Denervation  Patient: Kathryn Miller      Date of Birth: 1965-09-15 MRN: [id number] PCP: Pcp, No      Visit Date: 03/21/2023   Universal Protocol:    Date/Time: 03/03/254:11 PM  Consent Given By: the patient  Position: PRONE  Additional Comments: Vital signs were monitored before and after the procedure. Patient was prepped and draped in the usual sterile fashion. The correct patient, procedure, and site was verified.   Injection Procedure Details:   Procedure diagnoses:  1. Spondylosis without myelopathy or radiculopathy, lumbar region      Meds Administered:  Meds ordered this encounter  Medications   methylPREDNISolone acetate (DEPO-MEDROL) injection 40 mg   diazepam (VALIUM) 5 MG tablet    Sig: Take one tablet by mouth with light food one hour prior to procedure.    Dispense:  1 tablet    Refill:  0     Laterality: Left  Location/Site:  L4-L5, L3 and L4 medial branches and L5-S1, L4 medial branch and L5 dorsal ramus  Needle: 18 ga.,  10mm active tip, RF Cannula  Needle Placement: Along juncture of superior articular process and transverse pocess  Findings:  -Comments:  Procedure Details: For each desired target nerve, the corresponding transverse process (sacral ala for the L5 dorsal rami) was identified and the fluoroscope was positioned to square off the endplates of the corresponding vertebral body to achieve a true AP midline view.  The beam was then obliqued 15 to 20 degrees and caudally tilted 15 to 20 degrees to line up a trajectory along the target nerves. The skin over the target of the junction of superior articulating process and transverse process (sacral ala for the L5 dorsal rami) was infiltrated with 1ml of 1% Lidocaine without Epinephrine.  The 18 gauge 10mm active tip outer cannula was advanced in trajectory view to the target.  This procedure was repeated for each target nerve.  Then, for all levels, the outer  cannula placement was fine-tuned and the position was then confirmed with bi-planar imaging.    Test stimulation was done both at sensory and motor levels to ensure there was no radicular stimulation. The target tissues were then infiltrated with 1 ml of 1% Lidocaine without Epinephrine. Subsequently, a percutaneous neurotomy was carried out for 90 seconds at 80 degrees Celsius.  After the completion of the lesion, 1 ml of injectate was delivered. It was then repeated for each facet joint nerve mentioned above. Appropriate radiographs were obtained to verify the probe placement during the neurotomy.   Additional Comments:  No complications occurred Dressing: 2 x 2 sterile gauze and Band-Aid    Post-procedure details: Patient was observed during the procedure. Post-procedure instructions were reviewed.  Patient left the clinic in stable condition.

## 2023-04-04 ENCOUNTER — Other Ambulatory Visit: Payer: Self-pay

## 2023-04-04 ENCOUNTER — Ambulatory Visit (INDEPENDENT_AMBULATORY_CARE_PROVIDER_SITE_OTHER): Payer: 59 | Admitting: Physical Medicine and Rehabilitation

## 2023-04-04 VITALS — BP 112/78 | HR 74

## 2023-04-04 DIAGNOSIS — M47816 Spondylosis without myelopathy or radiculopathy, lumbar region: Secondary | ICD-10-CM | POA: Diagnosis not present

## 2023-04-04 MED ORDER — METHYLPREDNISOLONE ACETATE 40 MG/ML IJ SUSP
40.0000 mg | Freq: Once | INTRAMUSCULAR | Status: AC
  Administered 2023-04-04: 40 mg

## 2023-04-04 NOTE — Patient Instructions (Signed)

## 2023-04-04 NOTE — Progress Notes (Signed)
 Pain Scale   Average Pain 6        +Driver, -BT, -Dye Allergies.

## 2023-04-13 NOTE — Progress Notes (Signed)
 Kathryn Miller - 58 y.o. female MRN [id number]  Date of birth: 22-Dec-1965  Office Visit Note: Visit Date: 04/04/2023 PCP: Pcp, No Referred by: No ref. provider found  Subjective: Chief Complaint  Patient presents with   Lower Back - Pain   HPI:  Kathryn Miller is a 57 y.o. female who comes in todayfor planned repeat radiofrequency ablation of the Right L4-5 and L5-S1  Lumbar facet joints. This would be ablation of the corresponding medial branches and/or dorsal rami.  Patient has had double diagnostic blocks with more than 70% relief.  Subsequent ablation gave them more than 6 months of over 60% relief.  They have had chronic back pain for quite some time, more than 3 months, which has been an ongoing situation with recalcitrant axial back pain.  They have no radicular pain.  Their axial pain is worse with standing and ambulating and on exam today with facet loading.  They have had physical therapy as well as home exercise program.  The imaging noted in the chart below indicated facet pathology. Accordingly they meet all the criteria and qualification for for radiofrequency ablation and we are going to complete this today hopefully for more longer term relief as part of comprehensive management program.   ROS Otherwise per HPI.  Assessment & Plan: Visit Diagnoses:    ICD-10-CM   1. Spondylosis without myelopathy or radiculopathy, lumbar region  M47.816 methylPREDNISolone acetate (DEPO-MEDROL) injection 40 mg    XR C-ARM NO REPORT    Radiofrequency,Lumbar      Plan: No additional findings.   Meds & Orders:  Meds ordered this encounter  Medications   methylPREDNISolone acetate (DEPO-MEDROL) injection 40 mg    Orders Placed This Encounter  Procedures   Radiofrequency,Lumbar   XR C-ARM NO REPORT    Follow-up: Return if symptoms worsen or fail to improve.   Procedures: No procedures performed  Lumbar Facet Joint Nerve Denervation  Patient: Kathryn Miller      Date of  Birth: 01/04/1966 MRN: [id number] PCP: Pcp, No      Visit Date: 04/04/2023   Universal Protocol:    Date/Time: 03/26/259:51 AM  Consent Given By: the patient  Position: PRONE  Additional Comments: Vital signs were monitored before and after the procedure. Patient was prepped and draped in the usual sterile fashion. The correct patient, procedure, and site was verified.   Injection Procedure Details:   Procedure diagnoses:  1. Spondylosis without myelopathy or radiculopathy, lumbar region      Meds Administered:  Meds ordered this encounter  Medications   methylPREDNISolone acetate (DEPO-MEDROL) injection 40 mg     Laterality: Right  Location/Site:  L4-L5, L3 and L4 medial branches and L5-S1, L4 medial branch and L5 dorsal ramus  Needle: 18 ga.,  10mm active tip, RF Cannula  Needle Placement: Along juncture of superior articular process and transverse pocess  Findings:  -Comments:  Procedure Details: For each desired target nerve, the corresponding transverse process (sacral ala for the L5 dorsal rami) was identified and the fluoroscope was positioned to square off the endplates of the corresponding vertebral body to achieve a true AP midline view.  The beam was then obliqued 15 to 20 degrees and caudally tilted 15 to 20 degrees to line up a trajectory along the target nerves. The skin over the target of the junction of superior articulating process and transverse process (sacral ala for the L5 dorsal rami) was infiltrated with 1ml of 1% Lidocaine without Epinephrine.  The 18 gauge 10mm active tip outer cannula was advanced in trajectory view to the target.  This procedure was repeated for each target nerve.  Then, for all levels, the outer cannula placement was fine-tuned and the position was then confirmed with bi-planar imaging.    Test stimulation was done both at sensory and motor levels to ensure there was no radicular stimulation. The target tissues were then  infiltrated with 1 ml of 1% Lidocaine without Epinephrine. Subsequently, a percutaneous neurotomy was carried out for 90 seconds at 80 degrees Celsius.  After the completion of the lesion, 1 ml of injectate was delivered. It was then repeated for each facet joint nerve mentioned above. Appropriate radiographs were obtained to verify the probe placement during the neurotomy.   Additional Comments:  The patient tolerated the procedure well Dressing: 2 x 2 sterile gauze and Band-Aid    Post-procedure details: Patient was observed during the procedure. Post-procedure instructions were reviewed.  Patient left the clinic in stable condition.      Clinical History: MRI LUMBAR SPINE WITHOUT CONTRAST    TECHNIQUE:  Multiplanar, multisequence MR imaging of the lumbar spine was  performed. No intravenous contrast was administered.    COMPARISON:  Plain films October 19, 2019    FINDINGS:  Segmentation: Standard.    Alignment:  Trace anterolisthesis of L4 over L5.    Vertebrae:  No fracture, evidence of discitis, or bone lesion.    Conus medullaris and cauda equina: Conus extends to the L1-2 level.  Conus and cauda equina appear normal.    Paraspinal and other soft tissues: Negative.    Disc levels:    T12-L1: No spinal canal or neural foraminal stenosis.    L1-2: No spinal canal or neural foraminal stenosis.    L2-3: No spinal canal or neural foraminal stenosis.    L3-4: Shallow disc bulge and mild facet degenerative changes without  significant spinal canal or neural foraminal stenosis.    L4-5: Disc bulge with superimposed tiny left far lateral disc  protrusion, advanced facet degenerative changes and ligamentum  flavum redundancy without significant spinal canal or neural  foraminal stenosis.    L5-S1: Small left foraminal/far lateral disc protrusion, advanced  right and moderate left facet degenerative changes. Findings result  in mild left neural foraminal narrowing.  No spinal canal stenosis.    IMPRESSION:  1. Advanced degenerative changes of the facet joints at L4-L5 and  L5-S1, more pronounced on the right at L5-S1.  2. Mild left neural foraminal narrowing at L5-S1.  3. No significant spinal canal stenosis or evidence of nerve  impingement.      Electronically Signed    By: Baldemar Lenis M.D.    On: 02/13/2020 16:55     Objective:  VS:  HT:    WT:   BMI:     BP:112/78  HR:74bpm  TEMP: ( )  RESP:  Physical Exam Vitals and nursing note reviewed.  Constitutional:      General: She is not in acute distress.    Appearance: Normal appearance. She is obese. She is not ill-appearing.  HENT:     Head: Normocephalic and atraumatic.     Right Ear: External ear normal.     Left Ear: External ear normal.  Eyes:     Extraocular Movements: Extraocular movements intact.  Cardiovascular:     Rate and Rhythm: Normal rate.     Pulses: Normal pulses.  Pulmonary:     Effort: Pulmonary  effort is normal. No respiratory distress.  Abdominal:     General: There is no distension.     Palpations: Abdomen is soft.  Musculoskeletal:        General: Tenderness present.     Cervical back: Neck supple.     Right lower leg: No edema.     Left lower leg: No edema.     Comments: Patient has good distal strength with no pain over the greater trochanters.  No clonus or focal weakness.  Skin:    Findings: No erythema, lesion or rash.  Neurological:     General: No focal deficit present.     Mental Status: She is alert and oriented to person, place, and time.     Sensory: No sensory deficit.     Motor: No weakness or abnormal muscle tone.     Coordination: Coordination normal.  Psychiatric:        Mood and Affect: Mood normal.        Behavior: Behavior normal.      Imaging: No results found.

## 2023-04-13 NOTE — Procedures (Signed)
 Lumbar Facet Joint Nerve Denervation  Patient: Kathryn Miller      Date of Birth: 06-02-1965 MRN: [id number] PCP: Pcp, No      Visit Date: 04/04/2023   Universal Protocol:    Date/Time: 03/26/259:51 AM  Consent Given By: the patient  Position: PRONE  Additional Comments: Vital signs were monitored before and after the procedure. Patient was prepped and draped in the usual sterile fashion. The correct patient, procedure, and site was verified.   Injection Procedure Details:   Procedure diagnoses:  1. Spondylosis without myelopathy or radiculopathy, lumbar region      Meds Administered:  Meds ordered this encounter  Medications   methylPREDNISolone acetate (DEPO-MEDROL) injection 40 mg     Laterality: Right  Location/Site:  L4-L5, L3 and L4 medial branches and L5-S1, L4 medial branch and L5 dorsal ramus  Needle: 18 ga.,  10mm active tip, RF Cannula  Needle Placement: Along juncture of superior articular process and transverse pocess  Findings:  -Comments:  Procedure Details: For each desired target nerve, the corresponding transverse process (sacral ala for the L5 dorsal rami) was identified and the fluoroscope was positioned to square off the endplates of the corresponding vertebral body to achieve a true AP midline view.  The beam was then obliqued 15 to 20 degrees and caudally tilted 15 to 20 degrees to line up a trajectory along the target nerves. The skin over the target of the junction of superior articulating process and transverse process (sacral ala for the L5 dorsal rami) was infiltrated with 1ml of 1% Lidocaine without Epinephrine.  The 18 gauge 10mm active tip outer cannula was advanced in trajectory view to the target.  This procedure was repeated for each target nerve.  Then, for all levels, the outer cannula placement was fine-tuned and the position was then confirmed with bi-planar imaging.    Test stimulation was done both at sensory and motor  levels to ensure there was no radicular stimulation. The target tissues were then infiltrated with 1 ml of 1% Lidocaine without Epinephrine. Subsequently, a percutaneous neurotomy was carried out for 90 seconds at 80 degrees Celsius.  After the completion of the lesion, 1 ml of injectate was delivered. It was then repeated for each facet joint nerve mentioned above. Appropriate radiographs were obtained to verify the probe placement during the neurotomy.   Additional Comments:  The patient tolerated the procedure well Dressing: 2 x 2 sterile gauze and Band-Aid    Post-procedure details: Patient was observed during the procedure. Post-procedure instructions were reviewed.  Patient left the clinic in stable condition.

## 2023-04-21 DIAGNOSIS — J9801 Acute bronchospasm: Secondary | ICD-10-CM | POA: Diagnosis not present

## 2023-04-21 DIAGNOSIS — M62838 Other muscle spasm: Secondary | ICD-10-CM | POA: Diagnosis not present

## 2023-04-27 DIAGNOSIS — J40 Bronchitis, not specified as acute or chronic: Secondary | ICD-10-CM | POA: Diagnosis not present

## 2023-04-27 DIAGNOSIS — R051 Acute cough: Secondary | ICD-10-CM | POA: Diagnosis not present

## 2023-04-28 DIAGNOSIS — R051 Acute cough: Secondary | ICD-10-CM | POA: Diagnosis not present

## 2023-06-07 DIAGNOSIS — M129 Arthropathy, unspecified: Secondary | ICD-10-CM | POA: Diagnosis not present

## 2023-06-07 DIAGNOSIS — M5412 Radiculopathy, cervical region: Secondary | ICD-10-CM | POA: Diagnosis not present

## 2023-08-18 DIAGNOSIS — S43409A Unspecified sprain of unspecified shoulder joint, initial encounter: Secondary | ICD-10-CM | POA: Diagnosis not present

## 2023-08-18 DIAGNOSIS — M5412 Radiculopathy, cervical region: Secondary | ICD-10-CM | POA: Diagnosis not present

## 2023-09-06 ENCOUNTER — Ambulatory Visit (INDEPENDENT_AMBULATORY_CARE_PROVIDER_SITE_OTHER): Admitting: Physical Medicine and Rehabilitation

## 2023-09-06 ENCOUNTER — Encounter: Payer: Self-pay | Admitting: Physical Medicine and Rehabilitation

## 2023-09-06 DIAGNOSIS — M4802 Spinal stenosis, cervical region: Secondary | ICD-10-CM | POA: Diagnosis not present

## 2023-09-06 DIAGNOSIS — M5412 Radiculopathy, cervical region: Secondary | ICD-10-CM | POA: Diagnosis not present

## 2023-09-06 DIAGNOSIS — M7918 Myalgia, other site: Secondary | ICD-10-CM

## 2023-09-06 MED ORDER — PREGABALIN 75 MG PO CAPS: ORAL_CAPSULE | ORAL | 0 refills | Status: AC

## 2023-09-06 NOTE — Progress Notes (Signed)
 Pain Scale  Since 08/01/23 Average Pain 7    Neck into both arms L>R Numbness and tingling in left arm  Neck pops occasionally while moving Decrease ROM in both arms  Right handed

## 2023-09-06 NOTE — Progress Notes (Signed)
 [name] - 58 y.o. female MRN [id number]  Date of birth: 10/04/1965  Office Visit Note: Visit Date: 09/06/2023 PCP: Pcp, No Referred by: No ref. provider found  Subjective: No chief complaint on file.  HPI: Kathryn Miller is a 58 y.o. female who comes in today per the request of Dr. Valery Ripple for evaluation of acute on chronic bilateral neck pain radiating to shoulders and down left arm to hand. Reports feeling of bilateral arm heaviness. She reports chronic issues with neck pain, symptoms worsened about a month ago. Her pain is constant, describes as numbness and sharp sensation, currently rates as 4 out of 10. Some relief of pain with home exercise regimen, rest and use of medications. She has tried heat/ice and Ibuprofen  with minimal relief of pain. No history of formal physical therapy for her neck. No history of cervical surgery/injections. Cervical MRI imaging from 2022 shows mildly progressive cervical disc degeneration, worst at C6-C7 where there is mild spinal stenosis and moderate bilateral neural foraminal stenosis. No high grade spinal canal stenosis noted. Patient denies focal weakness. No recent trauma or falls.          Review of Systems  Musculoskeletal:  Positive for myalgias and neck pain.  Neurological:  Positive for tingling. Negative for focal weakness and weakness.  All other systems reviewed and are negative.  Otherwise per HPI.  Assessment & Plan: Visit Diagnoses:    ICD-10-CM   1. Radiculopathy, cervical region  M54.12     2. Foraminal stenosis of cervical region  M48.02     3. Myofascial pain syndrome  M79.18        Plan: Findings:  Acute on chronic bilateral neck pain radiating to shoulders and down left arm to hand. Patient continues to have symptoms despite good conservative therapies such as home exercise regimen, rest and use of medications. Patients clinical presentation and exam are complex, differentials include cervical  radiculopathy vs myofascial pain syndrome. Her symptoms started about a month ago, she does reports decreased pain over the last several week. Previous cervical MRI imaging from 2022 does show mild central canal and moderate foraminal stenosis at C6-C7, these findings could be a pain generator for her. There is myofascial tenderness noted to bilateral levator scapulae, trapezius and rhomboid regions upon palpation today. She is fairly tender upon light palpation. We discussed treatment plan in detail today. Next step is to place order for short course of formal physical therapy. I also discussed medication management and started her on Lyrica . Should her pain persist post physical therapy we would consider performing cervical epidural steroid injection. She has no questions at this time.     Meds & Orders: No orders of the defined types were placed in this encounter.  No orders of the defined types were placed in this encounter.   Follow-up: Return for 8 week follow up post physical therapy.   Procedures: No procedures performed      Clinical History: MRI LUMBAR SPINE WITHOUT CONTRAST    TECHNIQUE:  Multiplanar, multisequence MR imaging of the lumbar spine was  performed. No intravenous contrast was administered.    COMPARISON:  Plain films October 19, 2019    FINDINGS:  Segmentation: Standard.    Alignment:  Trace anterolisthesis of L4 over L5.    Vertebrae:  No fracture, evidence of discitis, or bone lesion.    Conus medullaris and cauda equina: Conus extends to the L1-2 level.  Conus and cauda equina appear normal.  Paraspinal and other soft tissues: Negative.    Disc levels:    T12-L1: No spinal canal or neural foraminal stenosis.    L1-2: No spinal canal or neural foraminal stenosis.    L2-3: No spinal canal or neural foraminal stenosis.    L3-4: Shallow disc bulge and mild facet degenerative changes without  significant spinal canal or neural foraminal stenosis.     L4-5: Disc bulge with superimposed tiny left far lateral disc  protrusion, advanced facet degenerative changes and ligamentum  flavum redundancy without significant spinal canal or neural  foraminal stenosis.    L5-S1: Small left foraminal/far lateral disc protrusion, advanced  right and moderate left facet degenerative changes. Findings result  in mild left neural foraminal narrowing. No spinal canal stenosis.    IMPRESSION:  1. Advanced degenerative changes of the facet joints at L4-L5 and  L5-S1, more pronounced on the right at L5-S1.  2. Mild left neural foraminal narrowing at L5-S1.  3. No significant spinal canal stenosis or evidence of nerve  impingement.      Electronically Signed    By: Katyucia  De Macedo Rodrigues M.D.    On: 02/13/2020 16:55   She reports that she has never smoked. She has never used smokeless tobacco. No results for input(s): HGBA1C, LABURIC in the last 8760 hours.  Objective:  VS:  HT:    WT:   BMI:     BP:   HR: bpm  TEMP: ( )  RESP:  Physical Exam Vitals and nursing note reviewed.  HENT:     Head: Normocephalic and atraumatic.     Right Ear: External ear normal.     Left Ear: External ear normal.     Nose: Nose normal.     Mouth/Throat:     Mouth: Mucous membranes are moist.  Eyes:     Extraocular Movements: Extraocular movements intact.  Cardiovascular:     Rate and Rhythm: Normal rate.     Pulses: Normal pulses.  Pulmonary:     Effort: Pulmonary effort is normal.  Abdominal:     General: Abdomen is flat. There is no distension.  Musculoskeletal:        General: Tenderness present.     Cervical back: Normal range of motion.     Comments: No discomfort noted with flexion, extension and side-to-side rotation. Patient has good strength in the upper extremities including 5 out of 5 strength in wrist extension, long finger flexion and APB. Limited range of motion and pain with movement of left shoulder. There is no atrophy of the  hands intrinsically. Sensation intact bilaterally. Myofascial tenderness noted upon palpation of bilateral levator scapulae, trapezius and rhomboid regions. Negative Hoffman's sign. Negative Spurling's sign.     Skin:    General: Skin is warm and dry.     Capillary Refill: Capillary refill takes less than 2 seconds.  Neurological:     General: No focal deficit present.     Mental Status: She is alert and oriented to person, place, and time.  Psychiatric:        Mood and Affect: Mood normal.        Behavior: Behavior normal.     Ortho Exam  Imaging: No results found.  Past Medical/Family/Surgical/Social History: Medications & Allergies reviewed per EMR, new medications updated. Patient Active Problem List   Diagnosis Date Noted   Chronic right-sided low back pain without sciatica 09/17/2019   Morbid obesity (HCC) 04/08/2016   Screen for colon cancer 04/08/2016  Thyrotoxicosis 07/16/2009   CHALAZION 07/15/2009   SYSTOLIC MURMUR 07/15/2009   METRORRHAGIA 10/07/2008   Vaginitis and vulvovaginitis 02/28/2008   HOT FLASHES 02/28/2008   Pain in limb 08/30/2007   Dental caries 08/08/2007   Allergic rhinitis 06/02/2007   Otitis media 03/09/2007   CARBUNCLE, ARM 03/09/2007   PLANTAR FASCIITIS 05/21/2006   ANEMIA-IRON DEFICIENCY 11/16/2005   Past Medical History:  Diagnosis Date   Anemia    Chest pain    Murmur, cardiac    Family History  Problem Relation Age of Onset   Breast cancer Mother    Diabetes Mother    Hypertension Mother    Past Surgical History:  Procedure Laterality Date   CESAREAN SECTION  [phone]   TUBAL LIGATION  2004   Social History   Occupational History   Occupation: unemployed  Tobacco Use   Smoking status: Never   Smokeless tobacco: Never  Vaping Use   Vaping status: Never Used  Substance and Sexual Activity   Alcohol use: No   Drug use: No   Sexual activity: Not on file

## 2023-09-07 ENCOUNTER — Telehealth: Payer: Self-pay | Admitting: Radiology

## 2023-09-07 NOTE — Telephone Encounter (Signed)
 Kathryn Miller (Kathryn Miller) PA Case ID #: [id number] Rx #: [id number] Need Help? Call us  at [phone] Outcome Approved on August 19 by [employer] NCPDP 2017 Your PA request has been approved. Additional information will be provided in the approval communication. (Message 1145) Effective Date: 09/06/2023 Authorization Expiration Date: 09/05/2024 Drug Pregabalin  75MG  capsules ePA cloud logo Form [employer] Electronic PA Form [phone] NCPDP) Original Claim Info [id number] MUST MEET STEP, PA REQR [id number] REQUIRES PRIOR AUTHORIZATION(PHARMACY HELP DESK [id number]) Prescriber Instructions    Patient  Prefix First Middle Last Suffix XX/XX/XXXX   Street [address] [employer] Zip [id number] Drug  No results found Confirm dosage form Not to exceed a 30-day supply No results found ICD-10 No results found ICD-10 Provider  First Last [employer] 2 (optional) [place] Zip XXX-XXX-XXXX XXX-XXX-XXXX Type of Review     Prescriber Next Steps    [facility] and Non-Formulary [place]  Mandate [facility] ...           Document Upload      Your request has been approved Your PA request has been approved. Additional information will be provided in the approval communication. (Message 1145)

## 2023-09-30 NOTE — Therapy (Incomplete)
 OUTPATIENT PHYSICAL THERAPY CERVICAL EVALUATION   Patient Name: JAKIYA BOOKBINDER MRN: [id number] DOB:[birth year], 58 y.o., female Today's Date: 09/30/2023  END OF SESSION:   Past Medical History:  Diagnosis Date   Anemia    Chest pain    Murmur, cardiac    Past Surgical History:  Procedure Laterality Date   CESAREAN SECTION  [phone]   TUBAL LIGATION  2004   Patient Active Problem List   Diagnosis Date Noted   Chronic right-sided low back pain without sciatica 09/17/2019   Morbid obesity (HCC) 04/08/2016   Screen for colon cancer 04/08/2016   Thyrotoxicosis 07/16/2009   CHALAZION 07/15/2009   SYSTOLIC MURMUR 07/15/2009   METRORRHAGIA 10/07/2008   Vaginitis and vulvovaginitis 02/28/2008   HOT FLASHES 02/28/2008   Pain in limb 08/30/2007   Dental caries 08/08/2007   Allergic rhinitis 06/02/2007   Otitis media 03/09/2007   CARBUNCLE, ARM 03/09/2007   PLANTAR FASCIITIS 05/21/2006   ANEMIA-IRON DEFICIENCY 11/16/2005    PCP: ***  REFERRING PROVIDER: ***  REFERRING DIAG: ***  THERAPY DIAG:  No diagnosis found.  Rationale for Evaluation and Treatment: {HABREHAB:27488}  ONSET DATE: ***  SUBJECTIVE:                                                                                                                                                                                                         SUBJECTIVE STATEMENT: *** Hand dominance: {MISC; OT HAND [phone]}  PERTINENT HISTORY:  ***  PAIN:  Are you having pain? {OPRCPAIN:27236}  PRECAUTIONS: {Therapy precautions:24002}  RED FLAGS: {PT Red Flags:29287}     WEIGHT BEARING RESTRICTIONS: {Yes ***/No:24003}  FALLS:  Has patient fallen in last 6 months? {fallsyesno:27318}  LIVING ENVIRONMENT: Lives with: {OPRC lives with:25569::lives with their family} Lives in: {Lives in:25570} Stairs: {opstairs:27293} Has following equipment at home: {Assistive devices:23999}  OCCUPATION:  ***  PLOF: {PLOF:24004}  PATIENT GOALS: ***  NEXT MD VISIT: ***  OBJECTIVE:  Note: Objective measures were completed at Evaluation unless otherwise noted.  DIAGNOSTIC FINDINGS:  ***  PATIENT SURVEYS:  {rehab surveys:24030}  COGNITION: Overall cognitive status: {cognition:24006}  SENSATION: {sensation:27233}  POSTURE: {posture:25561}  PALPATION: ***   CERVICAL ROM:   {AROM/PROM:27142} ROM A/PROM (deg) eval  Flexion   Extension   Right lateral flexion   Left lateral flexion   Right rotation   Left rotation    (Blank rows = not tested)  UPPER EXTREMITY ROM:  {AROM/PROM:27142} ROM Right eval Left eval  Shoulder flexion    Shoulder extension    Shoulder abduction    Shoulder  adduction    Shoulder extension    Shoulder internal rotation    Shoulder external rotation    Elbow flexion    Elbow extension    Wrist flexion    Wrist extension    Wrist ulnar deviation    Wrist radial deviation    Wrist pronation    Wrist supination     (Blank rows = not tested)  UPPER EXTREMITY MMT:  MMT Right eval Left eval  Shoulder flexion    Shoulder extension    Shoulder abduction    Shoulder adduction    Shoulder extension    Shoulder internal rotation    Shoulder external rotation    Middle trapezius    Lower trapezius    Elbow flexion    Elbow extension    Wrist flexion    Wrist extension    Wrist ulnar deviation    Wrist radial deviation    Wrist pronation    Wrist supination    Grip strength     (Blank rows = not tested)  CERVICAL SPECIAL TESTS:  {Cervical special tests:25246}  FUNCTIONAL TESTS:  {Functional tests:24029}  TREATMENT DATE: ***                                                                                                                                 PATIENT EDUCATION:  Education details: *** Person educated: {Person educated:25204} Education method: {Education Method:25205} Education comprehension: {Education  Comprehension:25206}  HOME EXERCISE PROGRAM: ***  ASSESSMENT:  CLINICAL IMPRESSION: Patient is a *** y.o. *** who was seen today for physical therapy evaluation and treatment for ***.   OBJECTIVE IMPAIRMENTS: {opptimpairments:25111}.   ACTIVITY LIMITATIONS: {activitylimitations:27494}  PARTICIPATION LIMITATIONS: {participationrestrictions:25113}  PERSONAL FACTORS: {Personal factors:25162} are also affecting patient's functional outcome.   REHAB POTENTIAL: {rehabpotential:25112}  CLINICAL DECISION MAKING: {clinical decision making:25114}  EVALUATION COMPLEXITY: {Evaluation complexity:25115}   GOALS: Goals reviewed with patient? {yes/no:20286}  SHORT TERM GOALS: Target date: ***  *** Baseline:  Goal status: INITIAL  2.  *** Baseline:  Goal status: INITIAL  3.  *** Baseline:  Goal status: INITIAL  4.  *** Baseline:  Goal status: INITIAL  5.  *** Baseline:  Goal status: INITIAL  6.  *** Baseline:  Goal status: INITIAL  LONG TERM GOALS: Target date: ***  *** Baseline:  Goal status: INITIAL  2.  *** Baseline:  Goal status: INITIAL  3.  *** Baseline:  Goal status: INITIAL  4.  *** Baseline:  Goal status: INITIAL  5.  *** Baseline:  Goal status: INITIAL  6.  *** Baseline:  Goal status: INITIAL   PLAN:  PT FREQUENCY: {rehab frequency:25116}  PT DURATION: {rehab duration:25117}  PLANNED INTERVENTIONS: {rehab planned interventions:25118::97110-Therapeutic exercises,97530- Therapeutic [id number]- Neuromuscular re-education,97535- Self Rjmz,02859- Manual therapy}  PLAN FOR NEXT SESSION: PIERRETTE Susannah Daring, PT, DPT 09/30/23 4:29 PM

## 2023-10-03 ENCOUNTER — Ambulatory Visit

## 2023-11-03 ENCOUNTER — Ambulatory Visit: Admitting: Physical Medicine and Rehabilitation

## 2023-11-04 ENCOUNTER — Ambulatory Visit: Admitting: Physical Medicine and Rehabilitation

## 2023-11-21 ENCOUNTER — Encounter: Payer: Self-pay | Admitting: Radiology

## 2023-12-13 ENCOUNTER — Ambulatory Visit: Admitting: Physical Medicine and Rehabilitation
